# Patient Record
Sex: Female | Born: 1955 | Race: White | Hispanic: No | Smoking: Former smoker
Health system: Southern US, Community
[De-identification: ages and names within clinical notes are randomized; demographics above are authoritative.]

## PROBLEM LIST (undated history)

## (undated) DIAGNOSIS — G8929 Other chronic pain: Secondary | ICD-10-CM

## (undated) DIAGNOSIS — F4310 Post-traumatic stress disorder, unspecified: Secondary | ICD-10-CM

## (undated) DIAGNOSIS — R011 Cardiac murmur, unspecified: Secondary | ICD-10-CM

## (undated) DIAGNOSIS — Z5189 Encounter for other specified aftercare: Secondary | ICD-10-CM

## (undated) DIAGNOSIS — I10 Essential (primary) hypertension: Secondary | ICD-10-CM

## (undated) DIAGNOSIS — F419 Anxiety disorder, unspecified: Secondary | ICD-10-CM

## (undated) DIAGNOSIS — M1990 Unspecified osteoarthritis, unspecified site: Secondary | ICD-10-CM

## (undated) DIAGNOSIS — H269 Unspecified cataract: Secondary | ICD-10-CM

## (undated) DIAGNOSIS — E785 Hyperlipidemia, unspecified: Secondary | ICD-10-CM

## (undated) DIAGNOSIS — B1920 Unspecified viral hepatitis C without hepatic coma: Secondary | ICD-10-CM

## (undated) DIAGNOSIS — F329 Major depressive disorder, single episode, unspecified: Secondary | ICD-10-CM

## (undated) DIAGNOSIS — K219 Gastro-esophageal reflux disease without esophagitis: Secondary | ICD-10-CM

## (undated) DIAGNOSIS — M542 Cervicalgia: Secondary | ICD-10-CM

## (undated) DIAGNOSIS — F32A Depression, unspecified: Secondary | ICD-10-CM

## (undated) DIAGNOSIS — C539 Malignant neoplasm of cervix uteri, unspecified: Secondary | ICD-10-CM

## (undated) DIAGNOSIS — F319 Bipolar disorder, unspecified: Secondary | ICD-10-CM

## (undated) DIAGNOSIS — N189 Chronic kidney disease, unspecified: Secondary | ICD-10-CM

## (undated) HISTORY — DX: Post-traumatic stress disorder, unspecified: F43.10

## (undated) HISTORY — DX: Cervicalgia: M54.2

## (undated) HISTORY — PX: APPENDECTOMY: SHX54

## (undated) HISTORY — DX: Unspecified cataract: H26.9

## (undated) HISTORY — DX: Unspecified osteoarthritis, unspecified site: M19.90

## (undated) HISTORY — DX: Malignant neoplasm of cervix uteri, unspecified: C53.9

## (undated) HISTORY — PX: KIDNEY STONE SURGERY: SHX686

## (undated) HISTORY — DX: Gastro-esophageal reflux disease without esophagitis: K21.9

## (undated) HISTORY — PX: CATARACT EXTRACTION: SUR2

## (undated) HISTORY — DX: Hyperlipidemia, unspecified: E78.5

## (undated) HISTORY — DX: Other chronic pain: G89.29

## (undated) HISTORY — DX: Chronic kidney disease, unspecified: N18.9

## (undated) HISTORY — PX: ABDOMINAL HYSTERECTOMY: SHX81

## (undated) HISTORY — DX: Bipolar disorder, unspecified: F31.9

## (undated) HISTORY — DX: Essential (primary) hypertension: I10

## (undated) HISTORY — DX: Unspecified viral hepatitis C without hepatic coma: B19.20

## (undated) HISTORY — DX: Encounter for other specified aftercare: Z51.89

---

## 2015-02-25 ENCOUNTER — Encounter: Payer: Self-pay | Admitting: Gastroenterology

## 2015-03-19 ENCOUNTER — Ambulatory Visit: Payer: Self-pay | Admitting: Gastroenterology

## 2015-03-26 ENCOUNTER — Ambulatory Visit (INDEPENDENT_AMBULATORY_CARE_PROVIDER_SITE_OTHER): Payer: Self-pay | Admitting: Gastroenterology

## 2015-03-26 ENCOUNTER — Encounter (INDEPENDENT_AMBULATORY_CARE_PROVIDER_SITE_OTHER): Payer: Self-pay

## 2015-03-26 ENCOUNTER — Other Ambulatory Visit: Payer: Self-pay

## 2015-03-26 ENCOUNTER — Encounter: Payer: Self-pay | Admitting: Gastroenterology

## 2015-03-26 VITALS — BP 132/88 | HR 100 | Temp 97.3°F | Ht 63.0 in | Wt 177.4 lb

## 2015-03-26 DIAGNOSIS — K219 Gastro-esophageal reflux disease without esophagitis: Secondary | ICD-10-CM | POA: Insufficient documentation

## 2015-03-26 DIAGNOSIS — Z1211 Encounter for screening for malignant neoplasm of colon: Secondary | ICD-10-CM

## 2015-03-26 DIAGNOSIS — R894 Abnormal immunological findings in specimens from other organs, systems and tissues: Secondary | ICD-10-CM

## 2015-03-26 DIAGNOSIS — K7460 Unspecified cirrhosis of liver: Secondary | ICD-10-CM

## 2015-03-26 DIAGNOSIS — R768 Other specified abnormal immunological findings in serum: Secondary | ICD-10-CM | POA: Insufficient documentation

## 2015-03-26 DIAGNOSIS — R1314 Dysphagia, pharyngoesophageal phase: Secondary | ICD-10-CM | POA: Insufficient documentation

## 2015-03-26 NOTE — Assessment & Plan Note (Signed)
No prior screening colonoscopy, no concerning lower GI symptoms, no family history of colon cancer.   Proceed with colonoscopy with Dr. Oneida Alar in the near future. The risks, benefits, and alternatives have been discussed in detail with the patient. They state understanding and desire to proceed.  Propofol due to polypharmacy

## 2015-03-26 NOTE — Assessment & Plan Note (Signed)
Query uncontrolled GERD, web, ring, stricture as culprit. Doubt malignancy.   Proceed with upper endoscopy and dilation in the near future with Dr. Oneida Alar. The risks, benefits, and alternatives have been discussed in detail with patient. They have stated understanding and desire to proceed.  PROPOFOL due to polypharmacy

## 2015-03-26 NOTE — Patient Instructions (Addendum)
Start taking Dexilant once each day. This is for reflux and takes the place of ranitidine. Stop Ranitidine.   I have ordered blood work to further check your Hepatitis C status.   We have ordered an ultrasound to check your liver.  You have been set up for a colonoscopy, upper endoscopy, and dilation with Dr. Oneida Alar in the near future.

## 2015-03-26 NOTE — Assessment & Plan Note (Signed)
Chronic, with associated vocal hoarseness and dysphagia. Stop Ranitidine, start Dexilant samples once daily. Patient assistance forms provided due to lack of insurance (applying for [employer]). Needs EGD in near future. See "dysphagia".

## 2015-03-26 NOTE — Assessment & Plan Note (Signed)
59 year old female with self-reported history of positive Hep C antibody, with risk factors to include prior IV drug abuse. No imaging completed or HCV RNA with PCR. Needs [place] with elastography, HCV RNA with reflex genotype. Further recommendations to follow. As of note, query hepatomegaly on exam.
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
## 2015-03-27 LAB — HCV RNA QUANT RFLX ULTRA OR GENOTYP: HCV Quantitative: NOT DETECTED IU/mL (ref <15)

## 2015-03-28 NOTE — Progress Notes (Signed)
Quick Note:  Good news! Patient's HCV RNA is NEGATIVE. She cleared the virus on her own (per her report she had hx of Hep C). Still complete ultrasound. ?hepatomegaly on exam. ______

## 2015-04-01 NOTE — Progress Notes (Signed)
Quick Note:  PT is aware of results and to complete the [place]. ______

## 2015-04-02 NOTE — Progress Notes (Signed)
cc'd to pcp 

## 2015-04-07 ENCOUNTER — Ambulatory Visit (HOSPITAL_COMMUNITY)
Admission: RE | Admit: 2015-04-07 | Discharge: 2015-04-07 | Disposition: A | Discharge status: Home / Self Care | Payer: Self-pay | Source: Ambulatory Visit | Attending: Gastroenterology | Admitting: Gastroenterology

## 2015-04-07 DIAGNOSIS — B182 Chronic viral hepatitis C: Secondary | ICD-10-CM | POA: Insufficient documentation

## 2015-04-07 DIAGNOSIS — K7460 Unspecified cirrhosis of liver: Secondary | ICD-10-CM | POA: Insufficient documentation

## 2015-04-09 NOTE — Patient Instructions (Signed)
Heather Galvan  04/09/2015     @PREFPERIOPPHARMACY @   Your procedure is scheduled on  04/15/2015  Report to [facility] at  615  A.M.  Call this number if you have problems the morning of surgery:  [phone]   Remember:  Do not eat food or drink liquids after midnight.  Take these medicines the morning of surgery with A SIP OF WATER  Effexor, celebrex, klonopin, flexaril, dexilant; voltaren, lisinopril, ultram   Do not wear jewelry, make-up or nail polish.  Do not wear lotions, powders, or perfumes.   Do not shave 48 hours prior to surgery.  Men may shave face and neck.  Do not bring valuables to the hospital.  [facility] is not responsible for any belongings or valuables.  Contacts, dentures or bridgework may not be worn into surgery.  Leave your suitcase in the car.  After surgery it may be brought to your room.  For patients admitted to the hospital, discharge time will be determined by your treatment team.  Patients discharged the day of surgery will not be allowed to drive home.   Name and phone number of your driver:  family Special instructions:  none  Please read over the following fact sheets that you were given. Pain Booklet, Coughing and Deep Breathing, Surgical Site Infection Prevention, Anesthesia Post-op Instructions and Care and Recovery After Surgery      Esophagogastroduodenoscopy Esophagogastroduodenoscopy (EGD) is a procedure to examine the lining of the esophagus, stomach, and first part of the small intestine (duodenum). A long, flexible, lighted tube with a camera attached (endoscope) is inserted down the throat to view these organs. This procedure is done to detect problems or abnormalities, such as inflammation, bleeding, ulcers, or growths, in order to treat them. The procedure lasts about 5-20 minutes. It is usually an outpatient procedure, but it may need to be performed in emergency cases in the hospital. LET YOUR CAREGIVER KNOW  ABOUT:   Allergies to food or medicine.  All medicines you are taking, including vitamins, herbs, eyedrops, and over-the-counter medicines and creams.  Use of steroids (by mouth or creams).  Previous problems you or members of your family have had with the use of anesthetics.  Any blood disorders you have.  Previous surgeries you have had.  Other health problems you have.  Possibility of pregnancy, if this applies. RISKS AND COMPLICATIONS  Generally, EGD is a safe procedure. However, as with any procedure, complications can occur. Possible complications include:  Infection.  Bleeding.  Tearing (perforation) of the esophagus, stomach, or duodenum.  Difficulty breathing or not being able to breath.  Excessive sweating.  Spasms of the larynx.  Slowed heartbeat.  Low blood pressure. BEFORE THE PROCEDURE  Do not eat or drink anything for 6-8 hours before the procedure or as directed by your caregiver.  Ask your caregiver about changing or stopping your regular medicines.  If you wear dentures, be prepared to remove them before the procedure.  Arrange for someone to drive you home after the procedure. PROCEDURE   A vein will be accessed to give medicines and fluids. A medicine to relax you (sedative) and a pain reliever will be given through that access into the vein.  A numbing medicine (local anesthetic) may be sprayed on your throat for comfort and to stop you from gagging or coughing.  A mouth guard may be placed in your mouth to protect your teeth and to keep  you from biting on the endoscope.  You will be asked to lie on your left side.  The endoscope is inserted down your throat and into the esophagus, stomach, and duodenum.  Air is put through the endoscope to allow your caregiver to view the lining of your esophagus clearly.  The esophagus, stomach, and duodenum is then examined. During the exam, your caregiver may:  Remove tissue to be examined under a  microscope (biopsy) for inflammation, infection, or other medical problems.  Remove growths.  Remove objects (foreign bodies) that are stuck.  Treat any bleeding with medicines or other devices that stop tissues from bleeding (hot cautery, clipping devices).  Widen (dilate) or stretch narrowed areas of the esophagus and stomach.  The endoscope will then be withdrawn. AFTER THE PROCEDURE  You will be taken to a recovery area to be monitored. You will be able to go home once you are stable and alert.  Do not eat or drink anything until the local anesthetic and numbing medicines have worn off. You may choke.  It is normal to feel bloated, have pain with swallowing, or have a sore throat for a short time. This will wear off.  Your caregiver should be able to discuss his or her findings with you. It will take longer to discuss the test results if any biopsies were taken. Document Released: 02/04/2005 Document Revised: 02/18/2014 Document Reviewed: 09/06/2012 [facility] Patient Information 2015 [place], [place]. This information is not intended to replace advice given to you by your health care provider. Make sure you discuss any questions you have with your health care provider. Esophageal Dilatation The esophagus is the long, narrow tube which carries food and liquid from the mouth to the stomach. Esophageal dilatation is the technique used to stretch a blocked or narrowed portion of the esophagus. This procedure is used when a part of the esophagus has become so narrow that it becomes difficult, painful or even impossible to swallow. This is generally an uncomplicated form of treatment. When this is not successful, chest surgery may be required. This is a much more extensive form of treatment with a longer recovery time. CAUSES  Some of the more common causes of blockage or strictures of the esophagus are:  Narrowing from longstanding inflammation (soreness and redness) of the lower esophagus.  This comes from the constant exposure of the lower esophagus to the acid which bubbles up from the stomach. Over time this causes scarring and narrowing of the lower esophagus.  Hiatal hernia in which a small part of the stomach bulges (herniates) up through the diaphragm. This can cause a gradual narrowing of the end of the esophagus.  Schatzki ring is a narrow ring of benign (non-cancerous) fibrous tissue which constricts the lower esophagus. The reason for this is not known.  Scleroderma is a connective tissue disorder that affects the esophagus and makes swallowing difficult.  Achalasia is an absence of nerves to the lower esophagus and to the esophageal sphincter. This is the circular muscle between the stomach and esophagus that relaxes to allow food into the stomach. After swallowing, it contracts to keep food in the stomach. This absence of nerves may be congenital (present since birth). This can cause irregular spasms of the lower esophageal muscle. This spasm does not open up to allow food and fluid through. The result is a persistent blockage with subsequent slow trickling of the esophageal contents into the stomach.  Strictures may develop from swallowing materials which damage the esophagus. Some  examples are strong acids or alkalis such as lye.  Growths such as benign (non-cancerous) and malignant (cancerous) tumors can block the esophagus.  Hereditary (present since birth) causes. DIAGNOSIS  Your caregiver often suspects this problem by taking a medical history. They will also do a physical exam. They can then prove their suspicions using X-rays and endoscopy. Endoscopy is an exam in which a tube like a small, flexible telescope is used to look at your esophagus.  TREATMENT There are different stretching (dilating) techniques that can be used. Simple bougie dilatation may be done in the office. This usually takes only a couple minutes. A numbing (anesthetic) spray of the throat is  used. Endoscopy, when done, is done in an endoscopy suite under mild sedation. When fluoroscopy is used, the procedure is performed in X-ray. Other techniques require a little longer time. Recovery is usually quick. There is no waiting time to begin eating and drinking to test success of the treatment. Following are some of the methods used. Narrowing of the esophagus is treated by making it bigger. Commonly this is a mechanical problem which can be treated with stretching. This can be done in different ways. Your caregiver will discuss these with you. Some of the means used are:  A series of graduated (increasing thickness) flexible dilators can be used. These are weighted tubes passed through the esophagus into the stomach. The tubes used become progressively larger until the desired stretched size is reached. Graduated dilators are a simple and quick way of opening the esophagus. No visualization is required.  Another method is the use of endoscopy to place a flexible wire across the stricture. The endoscope is removed and the wire left in place. A dilator with a hole through it from end to end is guided down the esophagus and across the stricture. One or more of these dilators are passed over the wire. At the end of the exam, the wire is removed. This type of treatment may be performed in the X-ray department under fluoroscopy. An advantage of this procedure is the examiner is visualizing the end opening in the esophagus.  Stretching of the esophagus may be done using balloons. Deflated balloons are placed through the endoscope and across the stricture. This type of balloon dilatation is often done at the time of endoscopy or fluoroscopy. Flexible endoscopy allows the examiner to directly view the stricture. A balloon is inserted in the deflated form into the area of narrowing. It is then inflated with air to a certain pressure that is preset for a given circumference. When inflated, it becomes sausage  shaped, stretched, and makes the stricture larger.  Achalasia requires a longer, larger balloon-type dilator. This is frequently done under X-ray control. In this situation, the spastic muscle fibers in the lower esophagus are stretched. All of the above procedures make the passage of food and water into the stomach easier. They also make it easier for stomach contents to reflux back into the esophagus. Special medications may be used following the procedure to help prevent further stricturing. Proton-pump inhibitor medications are good at decreasing the amount of acid in the stomach juice. When stomach juice refluxes into the esophagus, the juice is no longer as acidic and is less likely to burn or scar the esophagus. RISKS AND COMPLICATIONS Esophageal dilatation is usually performed effectively and without problems. Some complications that can occur are:  A small amount of bleeding almost always happens where the stretching takes place. If this is too excessive it  may require more aggressive treatment.  An uncommon complication is perforation (making a hole) of the esophagus. The esophagus is thin. It is easy to make a hole in it. If this happens, an operation may be necessary to repair this.  A small, undetected perforation could lead to an infection in the chest. This can be very serious. HOME CARE INSTRUCTIONS   If you received sedation for your procedure, do not drive, make important decisions, or perform any activities requiring your full coordination. Do not drink alcohol, take sedatives, or use any mind altering chemicals unless instructed by your caregiver.  You may use throat lozenges or warm salt water gargles if you have throat discomfort.  You can begin eating and drinking normally on return home unless instructed otherwise. Do not purposely try to force large chunks of food down to test the benefits of your procedure.  Mild discomfort can be eased with sips of ice  water.  Medications for discomfort may or may not be needed. SEEK IMMEDIATE MEDICAL CARE IF:   You begin vomiting up blood.  You develop black, tarry stools.  You develop chills or an unexplained temperature of over 101F (38.3C)  You develop chest or abdominal pain.  You develop shortness of breath, or feel light-headed or faint.  Your swallowing is becoming more painful, difficult, or you are unable to swallow. MAKE SURE YOU:   Understand these instructions.  Will watch your condition.  Will get help right away if you are not doing well or get worse. Document Released: 11/25/2005 Document Revised: 02/18/2014 Document Reviewed: 01/12/2006 [facility] Patient Information 2015 [place], [place]. This information is not intended to replace advice given to you by your health care provider. Make sure you discuss any questions you have with your health care provider. Colonoscopy A colonoscopy is an exam to look at the entire large intestine (colon). This exam can help find problems such as tumors, polyps, inflammation, and areas of bleeding. The exam takes about 1 hour.  LET [facility] CARE PROVIDER KNOW ABOUT:   Any allergies you have.  All medicines you are taking, including vitamins, herbs, eye drops, creams, and over-the-counter medicines.  Previous problems you or members of your family have had with the use of anesthetics.  Any blood disorders you have.  Previous surgeries you have had.  Medical conditions you have. RISKS AND COMPLICATIONS  Generally, this is a safe procedure. However, as with any procedure, complications can occur. Possible complications include:  Bleeding.  Tearing or rupture of the colon wall.  Reaction to medicines given during the exam.  Infection (rare). BEFORE THE PROCEDURE   Ask your health care provider about changing or stopping your regular medicines.  You may be prescribed an oral bowel prep. This involves drinking a large amount of  medicated liquid, starting the day before your procedure. The liquid will cause you to have multiple loose stools until your stool is almost clear or light green. This cleans out your colon in preparation for the procedure.  Do not eat or drink anything else once you have started the bowel prep, unless your health care provider tells you it is safe to do so.  Arrange for someone to drive you home after the procedure. PROCEDURE   You will be given medicine to help you relax (sedative).  You will lie on your side with your knees bent.  A long, flexible tube with a light and camera on the end (colonoscope) will be inserted through the rectum and  into the colon. The camera sends video back to a computer screen as it moves through the colon. The colonoscope also releases carbon dioxide gas to inflate the colon. This helps your health care provider see the area better.  During the exam, your health care provider may take a small tissue sample (biopsy) to be examined under a microscope if any abnormalities are found.  The exam is finished when the entire colon has been viewed. AFTER THE PROCEDURE   Do not drive for 24 hours after the exam.  You may have a small amount of blood in your stool.  You may pass moderate amounts of gas and have mild abdominal cramping or bloating. This is caused by the gas used to inflate your colon during the exam.  Ask when your test results will be ready and how you will get your results. Make sure you get your test results. Document Released: 10/01/2000 Document Revised: 07/25/2013 Document Reviewed: 06/11/2013 [facility] Patient Information 2015 [place], [place]. This information is not intended to replace advice given to you by your health care provider. Make sure you discuss any questions you have with your health care provider. PATIENT INSTRUCTIONS POST-ANESTHESIA  IMMEDIATELY FOLLOWING SURGERY:  Do not drive or operate machinery for the first twenty four hours  after surgery.  Do not make any important decisions for twenty four hours after surgery or while taking narcotic pain medications or sedatives.  If you develop intractable nausea and vomiting or a severe headache please notify your doctor immediately.  FOLLOW-UP:  Please make an appointment with your surgeon as instructed. You do not need to follow up with anesthesia unless specifically instructed to do so.  WOUND CARE INSTRUCTIONS (if applicable):  Keep a dry clean dressing on the anesthesia/puncture wound site if there is drainage.  Once the wound has quit draining you may leave it open to air.  Generally you should leave the bandage intact for twenty four hours unless there is drainage.  If the epidural site drains for more than 36-48 hours please call the anesthesia department.  QUESTIONS?:  Please feel free to call your physician or the hospital operator if you have any questions, and they will be happy to assist you.

## 2015-04-10 ENCOUNTER — Encounter (HOSPITAL_COMMUNITY): Payer: Self-pay

## 2015-04-10 ENCOUNTER — Encounter (HOSPITAL_COMMUNITY)
Admission: RE | Admit: 2015-04-10 | Discharge: 2015-04-10 | Disposition: A | Discharge status: Home / Self Care | Payer: Self-pay | Source: Ambulatory Visit | Attending: Gastroenterology | Admitting: Gastroenterology

## 2015-04-10 DIAGNOSIS — R1310 Dysphagia, unspecified: Secondary | ICD-10-CM | POA: Insufficient documentation

## 2015-04-10 DIAGNOSIS — Z01812 Encounter for preprocedural laboratory examination: Secondary | ICD-10-CM | POA: Insufficient documentation

## 2015-04-10 DIAGNOSIS — Z01810 Encounter for preprocedural cardiovascular examination: Secondary | ICD-10-CM | POA: Insufficient documentation

## 2015-04-10 HISTORY — DX: Depression, unspecified: F32.A

## 2015-04-10 HISTORY — DX: Cardiac murmur, unspecified: R01.1

## 2015-04-10 HISTORY — DX: Anxiety disorder, unspecified: F41.9

## 2015-04-10 HISTORY — DX: Major depressive disorder, single episode, unspecified: F32.9

## 2015-04-10 LAB — BASIC METABOLIC PANEL
Anion gap: 8 (ref 5–15)
BUN: 16 mg/dL (ref 6–20)
CALCIUM: 9.5 mg/dL (ref 8.9–10.3)
CO2: 32 mmol/L (ref 22–32)
Chloride: 99 mmol/L — ABNORMAL LOW (ref 101–111)
Creatinine, Ser: 0.96 mg/dL (ref 0.44–1.00)
GFR calc Af Amer: >60 mL/min (ref >60)
GLUCOSE: 107 mg/dL — AB (ref 65–99)
Potassium: 4.5 mmol/L (ref 3.5–5.1)
SODIUM: 139 mmol/L (ref 135–145)

## 2015-04-10 LAB — CBC WITH DIFFERENTIAL/PLATELET
Basophils Absolute: 0.1 10*3/uL (ref 0.0–0.1)
Basophils Relative: 1 % (ref 0–1)
EOS ABS: 0.1 10*3/uL (ref 0.0–0.7)
Eosinophils Relative: 2 % (ref 0–5)
HEMATOCRIT: 40.8 % (ref 36.0–46.0)
HEMOGLOBIN: 14.2 g/dL (ref 12.0–15.0)
Lymphocytes Relative: 33 % (ref 12–46)
Lymphs Abs: 3 10*3/uL (ref 0.7–4.0)
MCH: 31.3 pg (ref 26.0–34.0)
MCHC: 34.8 g/dL (ref 30.0–36.0)
MCV: 89.9 fL (ref 78.0–100.0)
MONOS PCT: 7 % (ref 3–12)
Monocytes Absolute: 0.6 10*3/uL (ref 0.1–1.0)
Neutro Abs: 5.2 10*3/uL (ref 1.7–7.7)
Neutrophils Relative %: 58 % (ref 43–77)
Platelets: 276 10*3/uL (ref 150–400)
RBC: 4.54 MIL/uL (ref 3.87–5.11)
RDW: 12.1 % (ref 11.5–15.5)
WBC: 8.9 10*3/uL (ref 4.0–10.5)

## 2015-04-13 NOTE — Progress Notes (Signed)
Quick Note:  Hep C RNA was negative. Elastrography showed Metavir score of F3/F4. She is scheduled for an EGD. This can help sort out if she has stigmata of chronic liver disease. Would recommend obtaining Hep A and B vaccinations if not already done. Also, she has a 41mm gallbladder polyp that needs surveillance. Needs repeat US abdomen in 6 months. Low threshold for elective cholecystectomy if any biliary issues in the future due to known gallbladder polyp and risk of cancer. ______

## 2015-04-14 NOTE — Progress Notes (Signed)
Quick Note:  Pt is aware. ______ 

## 2015-04-15 ENCOUNTER — Ambulatory Visit (HOSPITAL_COMMUNITY): Payer: Self-pay | Admitting: Anesthesiology

## 2015-04-15 ENCOUNTER — Encounter (HOSPITAL_COMMUNITY): Payer: Self-pay

## 2015-04-15 ENCOUNTER — Telehealth: Payer: Self-pay

## 2015-04-15 ENCOUNTER — Encounter (HOSPITAL_COMMUNITY)
Admission: RE | Disposition: A | Discharge status: Home / Self Care | Payer: Self-pay | Source: Ambulatory Visit | Attending: Gastroenterology

## 2015-04-15 ENCOUNTER — Ambulatory Visit (HOSPITAL_COMMUNITY)
Admission: RE | Admit: 2015-04-15 | Discharge: 2015-04-15 | Disposition: A | Discharge status: Home / Self Care | Payer: Self-pay | Source: Ambulatory Visit | Attending: Gastroenterology | Admitting: Gastroenterology

## 2015-04-15 DIAGNOSIS — R1310 Dysphagia, unspecified: Secondary | ICD-10-CM

## 2015-04-15 DIAGNOSIS — K648 Other hemorrhoids: Secondary | ICD-10-CM | POA: Insufficient documentation

## 2015-04-15 DIAGNOSIS — F4310 Post-traumatic stress disorder, unspecified: Secondary | ICD-10-CM | POA: Insufficient documentation

## 2015-04-15 DIAGNOSIS — G8929 Other chronic pain: Secondary | ICD-10-CM | POA: Insufficient documentation

## 2015-04-15 DIAGNOSIS — Z79899 Other long term (current) drug therapy: Secondary | ICD-10-CM | POA: Insufficient documentation

## 2015-04-15 DIAGNOSIS — F419 Anxiety disorder, unspecified: Secondary | ICD-10-CM | POA: Insufficient documentation

## 2015-04-15 DIAGNOSIS — K2950 Unspecified chronic gastritis without bleeding: Secondary | ICD-10-CM | POA: Insufficient documentation

## 2015-04-15 DIAGNOSIS — M542 Cervicalgia: Secondary | ICD-10-CM | POA: Insufficient documentation

## 2015-04-15 DIAGNOSIS — Z7982 Long term (current) use of aspirin: Secondary | ICD-10-CM | POA: Insufficient documentation

## 2015-04-15 DIAGNOSIS — K2970 Gastritis, unspecified, without bleeding: Secondary | ICD-10-CM

## 2015-04-15 DIAGNOSIS — F319 Bipolar disorder, unspecified: Secondary | ICD-10-CM | POA: Insufficient documentation

## 2015-04-15 DIAGNOSIS — K219 Gastro-esophageal reflux disease without esophagitis: Secondary | ICD-10-CM | POA: Insufficient documentation

## 2015-04-15 DIAGNOSIS — Q438 Other specified congenital malformations of intestine: Secondary | ICD-10-CM | POA: Insufficient documentation

## 2015-04-15 DIAGNOSIS — Z8541 Personal history of malignant neoplasm of cervix uteri: Secondary | ICD-10-CM | POA: Insufficient documentation

## 2015-04-15 DIAGNOSIS — Z1211 Encounter for screening for malignant neoplasm of colon: Secondary | ICD-10-CM | POA: Insufficient documentation

## 2015-04-15 DIAGNOSIS — B1920 Unspecified viral hepatitis C without hepatic coma: Secondary | ICD-10-CM | POA: Insufficient documentation

## 2015-04-15 HISTORY — PX: ESOPHAGOGASTRODUODENOSCOPY (EGD) WITH PROPOFOL: SHX5813

## 2015-04-15 HISTORY — PX: SAVORY DILATION: SHX5439

## 2015-04-15 HISTORY — PX: BIOPSY: SHX5522

## 2015-04-15 HISTORY — PX: COLONOSCOPY WITH PROPOFOL: SHX5780

## 2015-04-15 SURGERY — COLONOSCOPY WITH PROPOFOL
Anesthesia: Monitor Anesthesia Care

## 2015-04-15 MED ORDER — LIDOCAINE VISCOUS 2 % MT SOLN
15.0000 mL | OROMUCOSAL | Status: AC
  Administered 2015-04-15: 15 mL via OROMUCOSAL

## 2015-04-15 MED ORDER — LIDOCAINE HCL (CARDIAC) 10 MG/ML IV SOLN
INTRAVENOUS | Status: DC | PRN
  Administered 2015-04-15: 50 mg via INTRAVENOUS

## 2015-04-15 MED ORDER — PROPOFOL 10 MG/ML IV BOLUS
INTRAVENOUS | Status: AC
  Filled 2015-04-15: qty 20

## 2015-04-15 MED ORDER — MIDAZOLAM HCL 2 MG/2ML IJ SOLN
INTRAMUSCULAR | Status: AC
  Filled 2015-04-15: qty 2

## 2015-04-15 MED ORDER — GLYCOPYRROLATE 0.2 MG/ML IJ SOLN
0.2000 mg | Freq: Once | INTRAMUSCULAR | Status: AC
  Administered 2015-04-15: 0.2 mg via INTRAVENOUS

## 2015-04-15 MED ORDER — LIDOCAINE VISCOUS 2 % MT SOLN: OROMUCOSAL | Status: AC

## 2015-04-15 MED ORDER — LIDOCAINE HCL (PF) 1 % IJ SOLN
INTRAMUSCULAR | Status: AC
  Filled 2015-04-15: qty 5

## 2015-04-15 MED ORDER — WATER FOR IRRIGATION, STERILE IR SOLN
Status: DC | PRN
  Administered 2015-04-15: 1000 mL

## 2015-04-15 MED ORDER — MIDAZOLAM HCL 2 MG/2ML IJ SOLN
1.0000 mg | INTRAMUSCULAR | Status: DC | PRN
  Administered 2015-04-15: 2 mg via INTRAVENOUS

## 2015-04-15 MED ORDER — ONDANSETRON HCL 4 MG/2ML IJ SOLN
INTRAMUSCULAR | Status: AC
  Filled 2015-04-15: qty 2

## 2015-04-15 MED ORDER — PROPOFOL INFUSION 10 MG/ML OPTIME
INTRAVENOUS | Status: DC | PRN
  Administered 2015-04-15 (×2): 110 ug/kg/min via INTRAVENOUS
  Administered 2015-04-15: 125 ug/kg/min via INTRAVENOUS

## 2015-04-15 MED ORDER — STERILE WATER FOR IRRIGATION IR SOLN
Status: DC | PRN
  Administered 2015-04-15: 1000 mL

## 2015-04-15 MED ORDER — PROPOFOL 10 MG/ML IV BOLUS
INTRAVENOUS | Status: DC | PRN
  Administered 2015-04-15: 16 mg via INTRAVENOUS
  Administered 2015-04-15: 5 mg via INTRAVENOUS

## 2015-04-15 MED ORDER — FENTANYL CITRATE (PF) 100 MCG/2ML IJ SOLN: 25.0000 ug | INTRAMUSCULAR | Status: DC | PRN

## 2015-04-15 MED ORDER — LIDOCAINE VISCOUS 2 % MT SOLN
OROMUCOSAL | Status: AC
  Filled 2015-04-15: qty 15

## 2015-04-15 MED ORDER — GLYCOPYRROLATE 0.2 MG/ML IJ SOLN
INTRAMUSCULAR | Status: AC
  Filled 2015-04-15: qty 1

## 2015-04-15 MED ORDER — LACTATED RINGERS IV SOLN
INTRAVENOUS | Status: DC
  Administered 2015-04-15: 07:00:00 via INTRAVENOUS

## 2015-04-15 MED ORDER — ONDANSETRON HCL 4 MG/2ML IJ SOLN
4.0000 mg | Freq: Once | INTRAMUSCULAR | Status: AC
  Administered 2015-04-15: 4 mg via INTRAVENOUS

## 2015-04-15 MED ORDER — ONDANSETRON HCL 4 MG/2ML IJ SOLN: 4.0000 mg | Freq: Once | INTRAMUSCULAR | Status: DC | PRN

## 2015-04-15 MED ORDER — FENTANYL CITRATE (PF) 100 MCG/2ML IJ SOLN
INTRAMUSCULAR | Status: AC
  Filled 2015-04-15: qty 2

## 2015-04-15 MED ORDER — FENTANYL CITRATE (PF) 100 MCG/2ML IJ SOLN
25.0000 ug | INTRAMUSCULAR | Status: AC
  Administered 2015-04-15 (×2): 25 ug via INTRAVENOUS

## 2015-04-15 MED ORDER — MIDAZOLAM HCL 5 MG/5ML IJ SOLN
INTRAMUSCULAR | Status: DC | PRN
  Administered 2015-04-15 (×2): 0.5 mg via INTRAVENOUS
  Administered 2015-04-15: 1 mg via INTRAVENOUS

## 2015-04-15 MED ORDER — DEXLANSOPRAZOLE 60 MG PO CPDR: DELAYED_RELEASE_CAPSULE | ORAL | Status: DC

## 2015-04-15 SURGICAL SUPPLY — 27 items
BLOCK BITE 60FR ADLT L/F BLUE (MISCELLANEOUS) ×3 IMPLANT
ELECT REM PT RETURN 9FT ADLT (ELECTROSURGICAL)
ELECTRODE REM PT RTRN 9FT ADLT (ELECTROSURGICAL) IMPLANT
FCP BXJMBJMB 240X2.8X (CUTTING FORCEPS)
FLOOR PAD 36X40 (MISCELLANEOUS) ×3
FORCEP RJ3 GP 1.8X160 W-NEEDLE (CUTTING FORCEPS) IMPLANT
FORCEPS BIOP RAD 4 LRG CAP 4 (CUTTING FORCEPS) ×3 IMPLANT
FORCEPS BIOP RJ4 240 W/NDL (CUTTING FORCEPS)
FORCEPS BXJMBJMB 240X2.8X (CUTTING FORCEPS) IMPLANT
FORMALIN 10 PREFIL 20ML (MISCELLANEOUS) ×3 IMPLANT
INJECTOR/SNARE I SNARE (MISCELLANEOUS) IMPLANT
KIT ENDO PROCEDURE PEN (KITS) ×3 IMPLANT
MANIFOLD NEPTUNE II (INSTRUMENTS) ×3 IMPLANT
NEEDLE SCLEROTHERAPY 25GX240 (NEEDLE) IMPLANT
OVERTUBE ENDOCUFF GREEN (MISCELLANEOUS) ×3 IMPLANT
PAD FLOOR 36X40 (MISCELLANEOUS) ×1 IMPLANT
PROBE APC STR FIRE (PROBE) IMPLANT
PROBE INJECTION GOLD (MISCELLANEOUS)
PROBE INJECTION GOLD 7FR (MISCELLANEOUS) IMPLANT
SNARE ROTATE MED OVAL 20MM (MISCELLANEOUS) IMPLANT
SNARE SHORT THROW 13M SML OVAL (MISCELLANEOUS) IMPLANT
SYR 50ML LL SCALE MARK (SYRINGE) ×3 IMPLANT
SYR INFLATION 60ML (SYRINGE) IMPLANT
TRAP SPECIMEN MUCOUS 40CC (MISCELLANEOUS) IMPLANT
TUBING ENDO SMARTCAP PENTAX (MISCELLANEOUS) ×3 IMPLANT
TUBING IRRIGATION ENDOGATOR (MISCELLANEOUS) ×3 IMPLANT
WATER STERILE IRR 1000ML POUR (IV SOLUTION) ×3 IMPLANT

## 2015-04-15 NOTE — Telephone Encounter (Signed)
Pt is aware and she will try to do the salt water gargles and do the Lidocaine and if that doesn't work she will go to the ED.

## 2015-04-15 NOTE — Telephone Encounter (Signed)
Sent pager and paged Dr. Oneida Alar.

## 2015-04-15 NOTE — Progress Notes (Signed)
REVIEWED-NO ADDITIONAL RECOMMENDATIONS. 

## 2015-04-15 NOTE — Discharge Instructions (Signed)
YOU DID NOT HAVE ANY POLYPS. YOU HAVE small HEMORRHOIDS. 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 I STRETCHED YOUR ESOPHAGUS DUE YOUR PROBLEMS SWALLOWING. I DID NOT SEE A DEFINITE STRICTURE. I BIOPSIED YOUR STOMACH.   CONTINUE YOUR WEIGHT LOSS EFFORTS. Lose 10 lbs.  FOLLOW A HIGH FIBER/LOW FAT DIET. AVOID ITEMS THAT CAUSE BLOATING. SEE INFO BELOW.  CONTINUE DEXILANT PRIOR TO YOUR FIRST MEAL.   DO NOT USE VOLTAREN AND CELEBREX. IT WILL KILL YOUR KIDNEYS.  YOUR BIOPSY RESULTS WILL BE AVAILABLE IN MY CHART AFTER JUN 30 AND MY OFFICE WILL CONTACT YOU IN 10-14 DAYS WITH YOUR RESULTS.   Follow up in 6 mos.  Next colonoscopy in 10 years.   ENDOSCOPY Care After Read the instructions outlined below and refer to this sheet in the next week. These discharge instructions provide you with general information on caring for yourself after you leave the hospital. While your treatment has been planned according to the most current medical practices available, unavoidable complications occasionally occur. If you have any problems or questions after discharge, call DR. Jerard Bays, [phone].  ACTIVITY  You may resume your regular activity, but move at a slower pace for the next 24 hours.   Take frequent rest periods for the next 24 hours.   Walking will help get rid of the air and reduce the bloated feeling in your belly (abdomen).   No driving for 24 hours (because of the medicine (anesthesia) used during the test).   You may shower.   Do not sign any important legal documents or operate any machinery for 24 hours (because of the anesthesia used during the test).    NUTRITION  Drink plenty of fluids.   You may resume your normal diet as instructed by your doctor.   Begin with a light meal and progress to your normal diet. Heavy or fried foods are harder to digest and may make you feel sick to your stomach (nauseated).   Avoid alcoholic  beverages for 24 hours or as instructed.    MEDICATIONS  You may resume your normal medications.   WHAT YOU CAN EXPECT TODAY  Some feelings of bloating in the abdomen.   Passage of more gas than usual.   Spotting of blood in your stool or on the toilet paper  .  IF YOU HAD POLYPS REMOVED DURING THE ENDOSCOPY:  Eat a soft diet IF YOU HAVE NAUSEA, BLOATING, ABDOMINAL PAIN, OR VOMITING.    FINDING OUT THE RESULTS OF YOUR TEST Not all test results are available during your visit. DR. Oneida Alar WILL CALL YOU WITHIN 14 DAYS OF YOUR PROCEDUE WITH YOUR RESULTS. Do not assume everything is normal if you have not heard from DR. Kilan Banfill, CALL HER OFFICE AT [phone].  SEEK IMMEDIATE MEDICAL ATTENTION AND CALL THE OFFICE: [phone] IF:  You have more than a spotting of blood in your stool.   Your belly is swollen (abdominal distention).   You are nauseated or vomiting.   You have a temperature over 101F.   You have abdominal pain or discomfort that is severe or gets worse throughout the day.  Low-Fat Diet BREADS, CEREALS, PASTA, RICE, DRIED PEAS, AND BEANS These products are high in carbohydrates and most are low in fat. Therefore, they can be increased in the diet as substitutes for fatty foods. They too, however, contain calories and should not be eaten in excess. Cereals can be eaten for snacks as well as  for breakfast.   FRUITS AND VEGETABLES It is good to eat fruits and vegetables. Besides being sources of fiber, both are rich in vitamins and some minerals. They help you get the daily allowances of these nutrients. Fruits and vegetables can be used for snacks and desserts.  MEATS Limit lean meat, chicken, [place], and fish to no more than 6 ounces per day. Beef, Pork, and Lamb Use lean cuts of beef, pork, and lamb. Lean cuts include:  Extra-lean ground beef.  Arm roast.  Sirloin tip.  Center-cut ham.  Round steak.  Loin chops.  Rump roast.  Tenderloin.  Trim all fat  off the outside of meats before cooking. It is not necessary to severely decrease the intake of red meat, but lean choices should be made. Lean meat is rich in protein and contains a highly absorbable form of iron. Premenopausal women, in particular, should avoid reducing lean red meat because this could increase the risk for low red blood cells (iron-deficiency anemia).  Chicken and [place] These are good sources of protein. The fat of poultry can be reduced by removing the skin and underlying fat layers before cooking. Chicken and [place] can be substituted for lean red meat in the diet. Poultry should not be fried or covered with high-fat sauces. Fish and Shellfish Fish is a good source of protein. Shellfish contain cholesterol, but they usually are low in saturated fatty acids. The preparation of fish is important. Like chicken and [place], they should not be fried or covered with high-fat sauces. EGGS Egg whites contain no fat or cholesterol. They can be eaten often. Try 1 to 2 egg whites instead of whole eggs in recipes or use egg substitutes that do not contain yolk. MILK AND DAIRY PRODUCTS Use skim or 1% milk instead of 2% or whole milk. Decrease whole milk, natural, and processed cheeses. Use nonfat or low-fat (2%) cottage cheese or low-fat cheeses made from vegetable oils. Choose nonfat or low-fat (1 to 2%) yogurt. Experiment with evaporated skim milk in recipes that call for heavy cream. Substitute low-fat yogurt or low-fat cottage cheese for sour cream in dips and salad dressings. Have at least 2 servings of low-fat dairy products, such as 2 glasses of skim (or 1%) milk each day to help get your daily calcium intake. FATS AND OILS Reduce the total intake of fats, especially saturated fat. Butterfat, lard, and beef fats are high in saturated fat and cholesterol. These should be avoided as much as possible. Vegetable fats do not contain cholesterol, but certain vegetable fats, such as coconut oil,  palm oil, and palm kernel oil are very high in saturated fats. These should be limited. These fats are often used in bakery goods, processed foods, popcorn, oils, and nondairy creamers. Vegetable shortenings and some peanut butters contain hydrogenated oils, which are also saturated fats. Read the labels on these foods and check for saturated vegetable oils. Unsaturated vegetable oils and fats do not raise blood cholesterol. However, they should be limited because they are fats and are high in calories. Total fat should still be limited to 30% of your daily caloric intake. Desirable liquid vegetable oils are corn oil, cottonseed oil, olive oil, canola oil, safflower oil, soybean oil, and sunflower oil. Peanut oil is not as good, but small amounts are acceptable. Buy a heart-healthy tub margarine that has no partially hydrogenated oils in the ingredients. Mayonnaise and salad dressings often are made from unsaturated fats, but they should also be limited because of their  high calorie and fat content. Seeds, nuts, peanut butter, olives, and avocados are high in fat, but the fat is mainly the unsaturated type. These foods should be limited mainly to avoid excess calories and fat. OTHER EATING TIPS Snacks  Most sweets should be limited as snacks. They tend to be rich in calories and fats, and their caloric content outweighs their nutritional value. Some good choices in snacks are graham crackers, melba toast, soda crackers, bagels (no egg), English muffins, fruits, and vegetables. These snacks are preferable to snack crackers, [place] fries, TORTILLA CHIPS, and POTATO chips. Popcorn should be air-popped or cooked in small amounts of liquid vegetable oil. Desserts Eat fruit, low-fat yogurt, and fruit ices instead of pastries, cake, and cookies. Sherbet, angel food cake, gelatin dessert, frozen low-fat yogurt, or other frozen products that do not contain saturated fat (pure fruit juice bars, frozen ice pops) are  also acceptable.  COOKING METHODS Choose those methods that use little or no fat. They include: Poaching.  Braising.  Steaming.  Grilling.  Baking.  Stir-frying.  Broiling.  Microwaving.  Foods can be cooked in a nonstick pan without added fat, or use a nonfat cooking spray in regular cookware. Limit fried foods and avoid frying in saturated fat. Add moisture to lean meats by using water, broth, cooking wines, and other nonfat or low-fat sauces along with the cooking methods mentioned above. Soups and stews should be chilled after cooking. The fat that forms on top after a few hours in the refrigerator should be skimmed off. When preparing meals, avoid using excess salt. Salt can contribute to raising blood pressure in some people.  EATING AWAY FROM HOME Order entres, potatoes, and vegetables without sauces or butter. When meat exceeds the size of a deck of cards (3 to 4 ounces), the rest can be taken home for another meal. Choose vegetable or fruit salads and ask for low-calorie salad dressings to be served on the side. Use dressings sparingly. Limit high-fat toppings, such as bacon, crumbled eggs, cheese, sunflower seeds, and olives. Ask for heart-healthy tub margarine instead of butter.  High-Fiber Diet A high-fiber diet changes your normal diet to include more whole grains, legumes, fruits, and vegetables. Changes in the diet involve replacing refined carbohydrates with unrefined foods. The calorie level of the diet is essentially unchanged. The Dietary Reference Intake (recommended amount) for adult males is 38 grams per day. For adult females, it is 25 grams per day. Pregnant and lactating women should consume 28 grams of fiber per day. Fiber is the intact part of a plant that is not broken down during digestion. Functional fiber is fiber that has been isolated from the plant to provide a beneficial effect in the body. PURPOSE  Increase stool bulk.   Ease and regulate bowel movements.    Lower cholesterol.  INDICATIONS THAT YOU NEED MORE FIBER  Constipation and hemorrhoids.   Uncomplicated diverticulosis (intestine condition) and irritable bowel syndrome.   Weight management.   As a protective measure against hardening of the arteries (atherosclerosis), diabetes, and cancer.   GUIDELINES FOR INCREASING FIBER IN THE DIET  Start adding fiber to the diet slowly. A gradual increase of about 5 more grams (2 slices of whole-wheat bread, 2 servings of most fruits or vegetables, or 1 bowl of high-fiber cereal) per day is best. Too rapid an increase in fiber may result in constipation, flatulence, and bloating.   Drink enough water and fluids to keep your urine clear or pale yellow. Water,  juice, or caffeine-free drinks are recommended. Not drinking enough fluid may cause constipation.   Eat a variety of high-fiber foods rather than one type of fiber.   Try to increase your intake of fiber through using high-fiber foods rather than fiber pills or supplements that contain small amounts of fiber.   The goal is to change the types of food eaten. Do not supplement your present diet with high-fiber foods, but replace foods in your present diet.  INCLUDE A VARIETY OF FIBER SOURCES  Replace refined and processed grains with whole grains, canned fruits with fresh fruits, and incorporate other fiber sources. White rice, white breads, and most bakery goods contain little or no fiber.   Brown whole-grain rice, buckwheat oats, and many fruits and vegetables are all good sources of fiber. These include: broccoli, Brussels sprouts, cabbage, cauliflower, beets, sweet potatoes, white potatoes (skin on), carrots, tomatoes, eggplant, squash, berries, fresh fruits, and dried fruits.   Cereals appear to be the richest source of fiber. Cereal fiber is found in whole grains and bran. Bran is the fiber-rich outer coat of cereal grain, which is largely removed in refining. In whole-grain cereals, the  bran remains. In breakfast cereals, the largest amount of fiber is found in those with "bran" in their names. The fiber content is sometimes indicated on the label.   You may need to include additional fruits and vegetables each day.   In baking, for 1 cup white flour, you may use the following substitutions:   1 cup whole-wheat flour minus 2 tablespoons.   1/2 cup white flour plus 1/2 cup whole-wheat flour.     GERD  SYMPTOMS Common symptoms of GERD are heartburn (burning in your chest). This is worse when lying down or bending over. It may also cause belching and indigestion. Some of the things which make GERD worse are:  Increased weight pushes on stomach making acid rise more easily.   Smoking markedly increases acid production.   Alcohol decreases lower esophageal sphincter pressure (valve between stomach and esophagus), allowing acid from stomach into esophagus.   Late evening meals and going to bed with a full stomach increases pressure.   Anything that causes an increase in acid production.    HOME CARE INSTRUCTIONS  Try to achieve and maintain an ideal body weight.   Avoid drinking alcoholic beverages.   DO NOT smokE.   Do not wear tight clothing around your chest or stomach.   Eat smaller meals and eat more frequently. This keeps your stomach from getting too full. Eat slowly.   Do not lie down for 2 or 3 hours after eating. Do not eat or drink anything 1 to 2 hours before going to bed.   Avoid caffeine beverages (colas, coffee, cocoa, tea), fatty foods, citrus fruits and all other foods and drinks that contain acid and that seem to increase the problems.   Avoid bending over, especially after eating OR STRAINING. Anything that increases the pressure in your belly increases the amount of acid that may be pushed up into your esophagus.   Gastritis  Gastritis is an inflammation (the body's way of reacting to injury and/or infection) of the stomach. It is often  caused by viral or bacterial (germ) infections. It can also be caused BY ASPIRIN, BC/GOODY POWDER'S, (IBUPROFEN) MOTRIN, OR ALEVE (NAPROXEN), chemicals (including alcohol), SPICY FOODS, and medications. This illness may be associated with generalized malaise (feeling tired, not well), UPPER ABDOMINAL STOMACH cramps, and fever. One common bacterial cause  of gastritis is an organism known as H. Pylori. This can be treated with antibiotics.

## 2015-04-15 NOTE — Telephone Encounter (Signed)
PLEASE CALL PT. SHE HAS TWO CHOICES SHE CAN GO TO ED FOR AN EVALUATION OR SHE CAN USE SALT WATER GARGLES TID FOR 3 DAYS. SHE SHOULD USE VISCOUS LIDOCAINE 2 TSP Q4H PRN FOR THROAT PAIN.

## 2015-04-15 NOTE — Op Note (Signed)
[facility] [address] [place], [address]   ENDOSCOPY PROCEDURE REPORT  PATIENT: Heather Galvan, Heather Galvan  MR#: [id number] BIRTHDATE: 1955-12-06 , 89  yrs. old GENDER: female  ENDOSCOPIST: Danie Binder, MD REFFERED DD:[id number] Muse, PA  PROCEDURE DATE:  2015/05/13 PROCEDURE:   EGD with biopsy and EGD with dilatation over guidewire   INDICATIONS:1.  dyspepsia.   2.  dysphagia. USING ASA, VOLTAREN, AND CELEBREX MEDICATIONS: Monitored anesthesia care TOPICAL ANESTHETIC: Viscous Xylocaine  DESCRIPTION OF PROCEDURE:   After the risks benefits and alternatives of the procedure were thoroughly explained, informed consent was obtained.  The     endoscope was introduced through the mouth and advanced to the second portion of the duodenum. The instrument was slowly withdrawn as the mucosa was carefully examined.  Prior to withdrawal of the scope, the guidwire was placed.  The esophagus was dilated successfully.  The patient was recovered in endoscopy and discharged home in satisfactory condition. Estimated blood loss is zero unless otherwise noted in this procedure report.   ESOPHAGUS: The mucosa of the esophagus appeared normal.   STOMACH: Moderate non-erosive gastritis (inflammation) was found in the gastric body and gastric antrum.  Multiple biopsies were performed using cold forceps.   DUODENUM: The duodenal mucosa showed no abnormalities in the bulb and second portion of the duodenum. EMPIRIC DILATION PERFORMED DUE TO C/O DYSPHAGIA AND POSSIBLE PROXIMAL ESOPHAGEAL WEB. Dilation was then performed at the gastroesphageal junction Dilator: Savary over guidewire Size(s): 15-16 MM Resistance: minimal Heme: none  COMPLICATIONS: There were no immediate complications.  ENDOSCOPIC IMPRESSION: 1.   The mucosa of the esophagus appeared normal 2.   MILD Non-erosive gastritis  RECOMMENDATIONS: CONTINUE YOUR WEIGHT LOSS EFFORTS.  Lose 10 lbs. FOLLOW A HIGH FIBER/LOW FAT  DIET. CONTINUE DEXILANT PRIOR TO YOUR FIRST MEAL. DO NOT USE VOLTAREN AND CELEBREX. AWAIT BIOPSY RESULTS. Follow up in 6 mos. Next colonoscopy in 10 years.  eSigned:  Danie Binder, MD 2015-05-13 9:57 AM   CPT CODES: ICD CODES:  The ICD and CPT codes recommended by this software are interpretations from the data that the clinical staff has captured with the software.  The verification of the translation of this report to the ICD and CPT codes and modifiers is the sole responsibility of the health care institution and practicing physician where this report was generated.  [employer] will not be held responsible for the validity of the ICD and CPT codes included on this report.  AMA assumes no liability for data contained or not contained herein. CPT is a Designer, television/film set of the [employer].

## 2015-04-15 NOTE — Anesthesia Procedure Notes (Signed)
Procedure Name: MAC Date/Time: 04/15/2015 7:32 AM Performed by: Vista Deck Pre-anesthesia Checklist: Patient identified, Emergency Drugs available, Suction available, Timeout performed and Patient being monitored Patient Re-evaluated:Patient Re-evaluated prior to inductionOxygen Delivery Method: Non-rebreather mask

## 2015-04-15 NOTE — H&P (Signed)
Primary Care Physician:  Raiford Simmonds., PA-C Primary Gastroenterologist:  Dr. Oneida Alar  Pre-Procedure History & Physical: HPI:  Heather Galvan is a 59 y.o. female here for DYSPHAGIA/screening.  Past Medical History  Diagnosis Date  . Hepatitis C   . Bipolar disease, chronic   . PTSD (post-traumatic stress disorder)   . GERD (gastroesophageal reflux disease)   . Chronic neck pain   . Anxiety   . Depression   . Cervical cancer   . Heart murmur     Past Surgical History  Procedure Laterality Date  . Abdominal hysterectomy      ovaries remain  . Cesarean section      X2  . Appendectomy    . Kidney stone surgery    . Cataract extraction      Prior to Admission medications   Medication Sig Start Date End Date Taking? Authorizing Provider  aspirin 81 MG tablet Take 81 mg by mouth daily.   Yes Historical Provider, MD  celecoxib (CELEBREX) 100 MG capsule Take 100 mg by mouth 2 (two) times daily.   Yes Historical Provider, MD  clonazePAM (KLONOPIN) 1 MG tablet Take 1 mg by mouth 3 (three) times daily.   Yes Historical Provider, MD  clonazePAM (KLONOPIN) 1 MG tablet Take 1 mg by mouth 2 (two) times daily.   Yes Historical Provider, MD  cyclobenzaprine (FLEXERIL) 10 MG tablet Take 10 mg by mouth 3 (three) times daily.   Yes Historical Provider, MD  dexlansoprazole (DEXILANT) 60 MG capsule Take 60 mg by mouth daily.   Yes Historical Provider, MD  diclofenac (VOLTAREN) 75 MG EC tablet Take 75 mg by mouth 2 (two) times daily.   Yes Historical Provider, MD  lisinopril-hydrochlorothiazide (PRINZIDE,ZESTORETIC) 10-12.5 MG per tablet Take 1 tablet by mouth daily.   Yes Historical Provider, MD  traMADol (ULTRAM-ER) 100 MG 24 hr tablet Take 100 mg by mouth daily as needed for pain.   Yes Historical Provider, MD  trazodone (DESYREL) 300 MG tablet Take 300 mg by mouth at bedtime.   Yes Historical Provider, MD  venlafaxine XR (EFFEXOR-XR) 150 MG 24 hr capsule Take 300 mg by mouth daily with  breakfast.   Yes Historical Provider, MD    Allergies as of 03/26/2015  . (No Known Allergies)    Family History  Problem Relation Age of Onset  . Cirrhosis Brother     alcoholic, passed away in their 4s  . Colon cancer Paternal Uncle   . Breast cancer Mother   . Uterine cancer Mother     History   Social History  . Marital Status: Legally Separated    Spouse Name: N/A  . Number of Children: N/A  . Years of Education: N/A   Occupational History  . Not on file.   Social History Main Topics  . Smoking status: Never Smoker   . Smokeless tobacco: Not on file  . Alcohol Use: No  . Drug Use: No     Comment: remote past IV drug use, marijuana intermittently  . Sexual Activity: Not on file   Other Topics Concern  . Not on file   Social History Narrative    Review of Systems: See HPI, otherwise negative ROS   Physical Exam: BP 133/80 mmHg  Pulse 54  Temp(Src) 97.6 F (36.4 C) (Oral)  Resp 18  Ht 5\' 3"  (1.6 m)  Wt 177 lb (80.287 kg)  BMI 31.36 kg/m2  SpO2 96% General:   Alert,  pleasant and cooperative in NAD  Head:  Normocephalic and atraumatic. Neck:  Supple; Lungs:  Clear throughout to auscultation.    Heart:  Regular rate and rhythm. Abdomen:  Soft, nontender and nondistended. Normal bowel sounds, without guarding, and without rebound.   Neurologic:  Alert and  oriented x4;  grossly normal neurologically.  Impression/Plan:    DYSPHAGIA/screening  PLAN:  EGD/DIL/tcs TODAY

## 2015-04-15 NOTE — Telephone Encounter (Signed)
PT called crying and said she had endoscopy done this Am and she cannot talk or swallow now. She was getting sound out to me some. She is requesting something to make her throat feel better and help her be ready to talk and to swallow.  Sending to Dr. Oneida Alar and sending pager also.

## 2015-04-15 NOTE — Op Note (Signed)
[facility] [address] [place], [address]   COLONOSCOPY PROCEDURE REPORT  PATIENT: Heather, Galvan  MR#: [id number] BIRTHDATE: 1955/11/27 , 62  yrs. old GENDER: female ENDOSCOPIST: Danie Binder, MD REFERRED KV:[id number] Muse, PA PROCEDURE DATE:  05-10-2015 PROCEDURE:   Colonoscopy, screening INDICATIONS:average risk patient for colon cancer. MEDICATIONS: Monitored anesthesia care  DESCRIPTION OF PROCEDURE:    Physical exam was performed.  Informed consent was obtained from the patient after explaining the benefits, risks, and alternatives to procedure.  The patient was connected to monitor and placed in left lateral position. Continuous oxygen was provided by nasal cannula and IV medicine administered through an indwelling cannula.  After administration of sedation and rectal exam, the patients rectum was intubated and the     colonoscope was advanced under direct visualization to the CECUM. The scope was removed slowly by carefully examining the color, texture, anatomy, and integrity mucosa on the way out.  The patient was recovered in endoscopy and discharged home in satisfactory condition. Estimated blood loss is zero unless otherwise noted in this procedure report.    COLON FINDINGS: The colon was redundant.  Manual abdominal counter-pressure was used to reach the cecum, The colonic mucosa appeared normal.  , and Small internal hemorrhoids were found.  PREP QUALITY: good.  CECAL W/D TIME: 11       minutes COMPLICATIONS: None  ENDOSCOPIC IMPRESSION: 1.   The LEFT colon IS redundant 2.   The colonic mucosa appeared normal 3.   Small internal hemorrhoids  RECOMMENDATIONS: CONTINUE YOUR WEIGHT LOSS EFFORTS.  Lose 10 lbs. FOLLOW A HIGH FIBER/LOW FAT DIET. CONTINUE DEXILANT PRIOR TO YOUR FIRST MEAL. DO NOT USE VOLTAREN AND CELEBREX. AWAIT BIOPSY RESULTS. Follow up in 6 mos. Next colonoscopy in 10  years.   _______________________________ eSignedDanie Binder, MD 05-10-2015 9:53 AM   CPT CODES: ICD CODES:  The ICD and CPT codes recommended by this software are interpretations from the data that the clinical staff has captured with the software.  The verification of the translation of this report to the ICD and CPT codes and modifiers is the sole responsibility of the health care institution and practicing physician where this report was generated.  [employer]. will not be held responsible for the validity of the ICD and CPT codes included on this report.  AMA assumes no liability for data contained or not contained herein. CPT is a Designer, television/film set of the [employer].

## 2015-04-15 NOTE — Anesthesia Preprocedure Evaluation (Signed)
Anesthesia Evaluation  Patient identified by MRN, date of birth, ID band Patient awake    Reviewed: Allergy & Precautions, NPO status , Patient's Chart, lab work & pertinent test results  Airway Mallampati: II  TM Distance: >3 FB     Dental  (+) Edentulous Upper, Edentulous Lower   Pulmonary neg pulmonary ROS,  breath sounds clear to auscultation        Cardiovascular negative cardio ROS  Rhythm:Regular Rate:Normal     Neuro/Psych PSYCHIATRIC DISORDERS (PTSD) Anxiety Depression Bipolar Disorder    GI/Hepatic GERD-  Medicated,(+) Hepatitis -, C  Endo/Other    Renal/GU      Musculoskeletal   Abdominal   Peds  Hematology   Anesthesia Other Findings   Reproductive/Obstetrics                             Anesthesia Physical Anesthesia Plan  ASA: III  Anesthesia Plan: MAC   Post-op Pain Management:    Induction: Intravenous  Airway Management Planned: Simple Face Mask  Additional Equipment:   Intra-op Plan:   Post-operative Plan:   Informed Consent: I have reviewed the patients History and Physical, chart, labs and discussed the procedure including the risks, benefits and alternatives for the proposed anesthesia with the patient or authorized representative who has indicated his/her understanding and acceptance.     Plan Discussed with:   Anesthesia Plan Comments:         Anesthesia Quick Evaluation

## 2015-04-15 NOTE — Transfer of Care (Signed)
Immediate Anesthesia Transfer of Care Note  Patient: Heather Galvan  Procedure(s) Performed: Procedure(s): COLONOSCOPY WITH PROPOFOL; in cecum at 0752 ; total withdrawal time 11 minutes (procedure #1) (N/A) ESOPHAGOGASTRODUODENOSCOPY (EGD) WITH PROPOFOL (procedure #2) (N/A) SAVORY DILATION, 15 mm, 16 mm (N/A) BIOPSY (N/A)  Patient Location: PACU  Anesthesia Type:MAC  Level of Consciousness: awake and patient cooperative  Airway & Oxygen Therapy: Patient Spontanous Breathing and non-rebreather face mask  Post-op Assessment: Report given to RN, Post -op Vital signs reviewed and stable and Patient moving all extremities  Post vital signs: Reviewed and stable    Complications: No apparent anesthesia complications

## 2015-04-15 NOTE — Anesthesia Postprocedure Evaluation (Signed)
Anesthesia Post Note  Patient: Heather Galvan  Procedure(s) Performed: Procedure(s) (LRB): COLONOSCOPY WITH PROPOFOL; in cecum at 0752 ; total withdrawal time 11 minutes (procedure #1) (N/A) ESOPHAGOGASTRODUODENOSCOPY (EGD) WITH PROPOFOL (procedure #2) (N/A) SAVORY DILATION, 15 mm, 16 mm (N/A) BIOPSY (N/A)  Anesthesia type: MAC  Patient location: PACU  Post pain: Pain level controlled  Post assessment: Post-op Vital signs reviewed, Patient's Cardiovascular Status Stable, Respiratory Function Stable, Patent Airway, No signs of Nausea or vomiting and Pain level controlled    Post vital signs: Reviewed and stable  Level of consciousness: awake and alert   Complications: No apparent anesthesia complications

## 2015-04-16 ENCOUNTER — Encounter (HOSPITAL_COMMUNITY): Payer: Self-pay | Admitting: Gastroenterology

## 2015-04-28 ENCOUNTER — Telehealth: Payer: Self-pay

## 2015-04-28 NOTE — Telephone Encounter (Signed)
Pt came by office and thought she had an appointment with [place] today. Pt had procedure recently with [facility] and i did not see any other appointment for her with our office. Pt may need to be contacted to assess this situation

## 2015-04-28 NOTE — Telephone Encounter (Signed)
I called pt and she said she [facility] and [facility] is [facility].

## 2015-05-01 NOTE — Telephone Encounter (Signed)
REVIEWED-NO ADDITIONAL RECOMMENDATIONS. 

## 2015-05-06 NOTE — Telephone Encounter (Signed)
Reminder in epic °

## 2015-05-06 NOTE — Telephone Encounter (Signed)
Pt is aware of results. 

## 2015-05-06 NOTE — Telephone Encounter (Addendum)
Please call pt. HER stomach Bx shows gastritis DUE TO ASA, CELEBREX, AND VOLTAREN.    CONTINUE YOUR WEIGHT LOSS EFFORTS. Lose 10 lbs.  FOLLOW A HIGH FIBER/LOW FAT DIET. AVOID ITEMS THAT CAUSE BLOATING.   CONTINUE DEXILANT PRIOR TO YOUR FIRST MEAL.   DO NOT USE VOLTAREN AND CELEBREX. IT WILL KILL YOUR KIDNEYS.  Follow up in 6 mos E30 HISTORY OF HEP C, F3/F4 FIBROSIS.  Next colonoscopy in 10 years.

## 2015-10-07 ENCOUNTER — Encounter: Payer: Self-pay | Admitting: Gastroenterology

## 2015-11-07 IMAGING — US US ABDOMEN COMPLETE W/ ELASTOGRAPHY
1 series · 13 of 25 positions shown · non-contrast
Comparison: None.

CLINICAL DATA: Chronic hepatitis-C



[Series 1: us abdomen complete w/ elastography · 0.21mm/px · 13 of 50 slices shown]
[im 1/50]
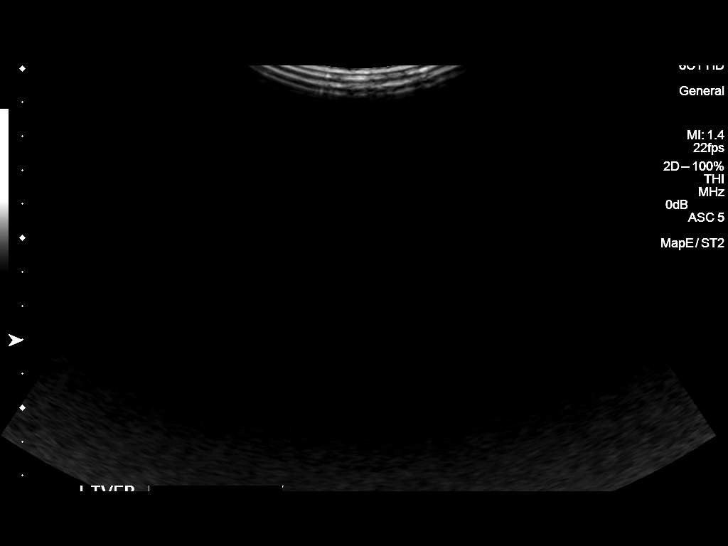
[im 5/50]
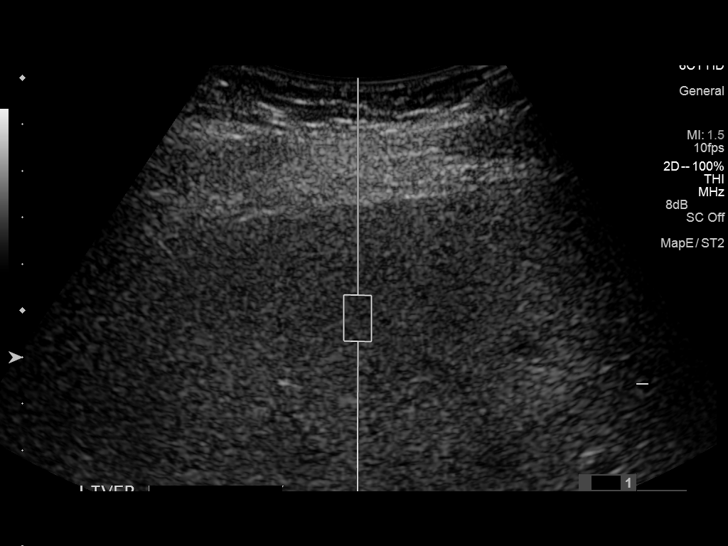
[im 9/50]
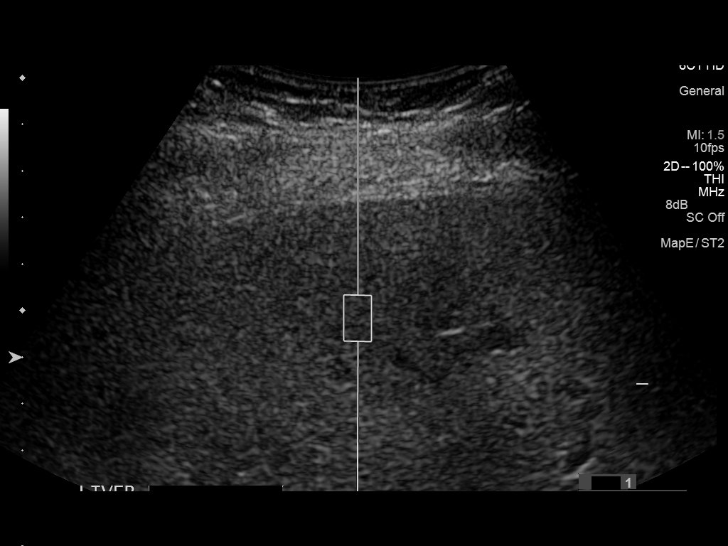
[im 13/50]
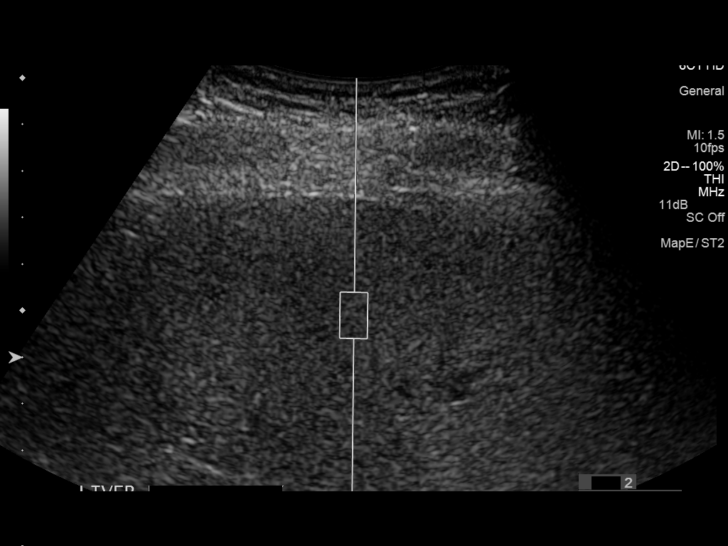
[im 17/50]
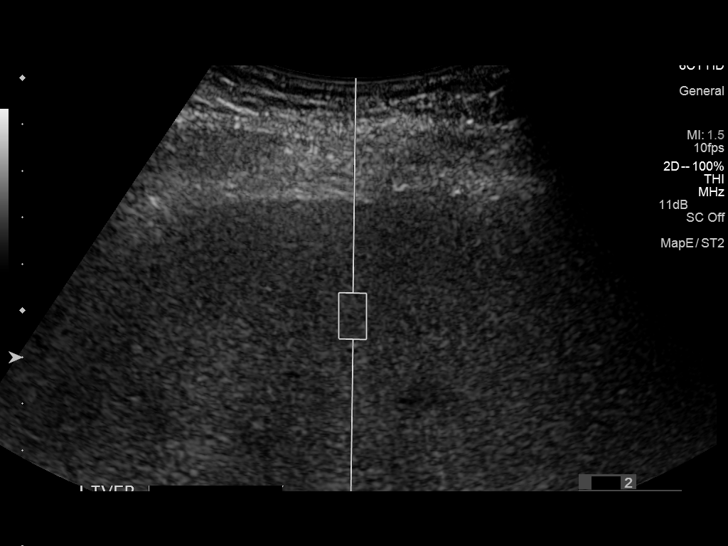
[im 21/50]
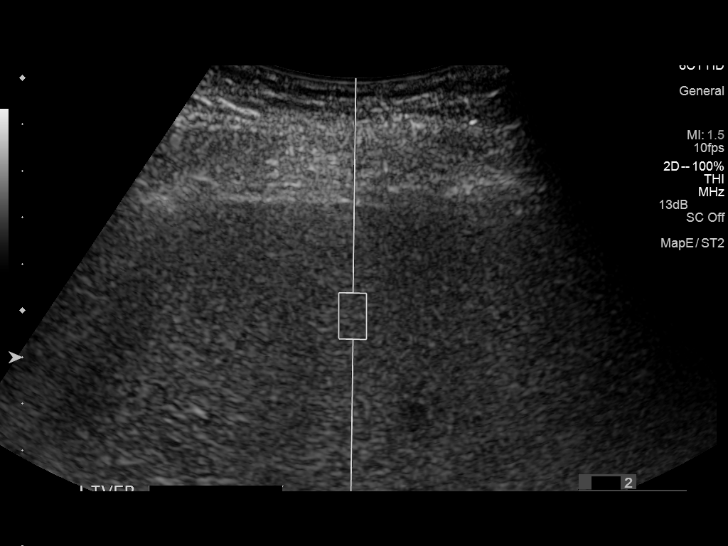
[im 25/50]
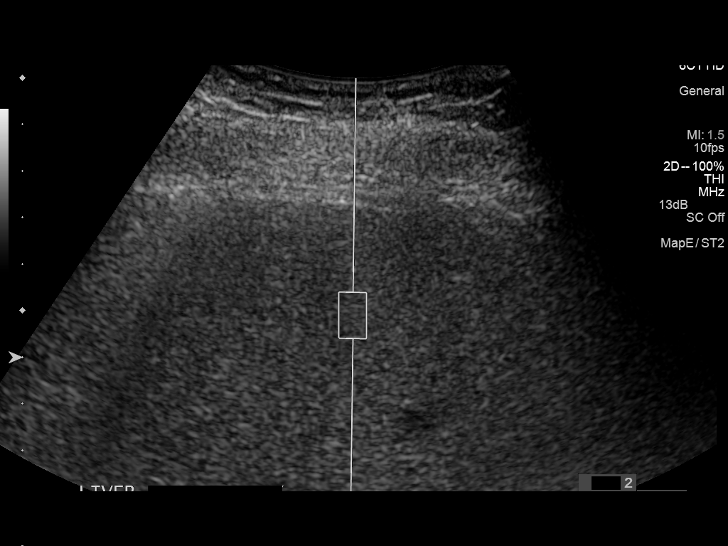
[im 29/50]
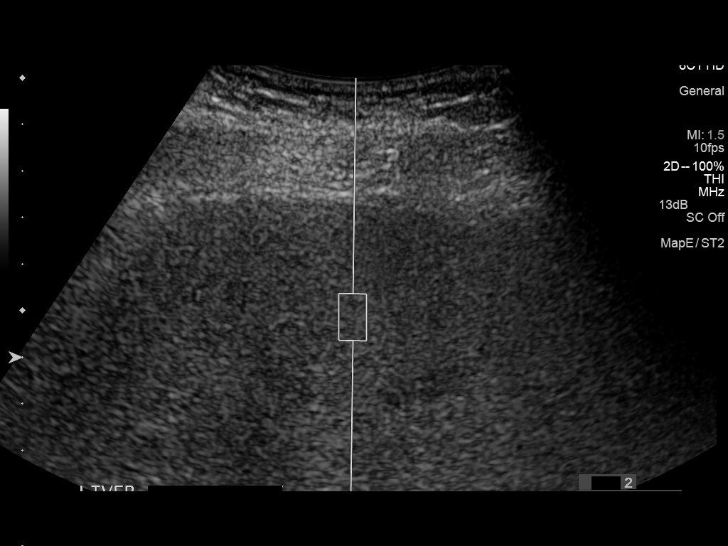
[im 33/50]
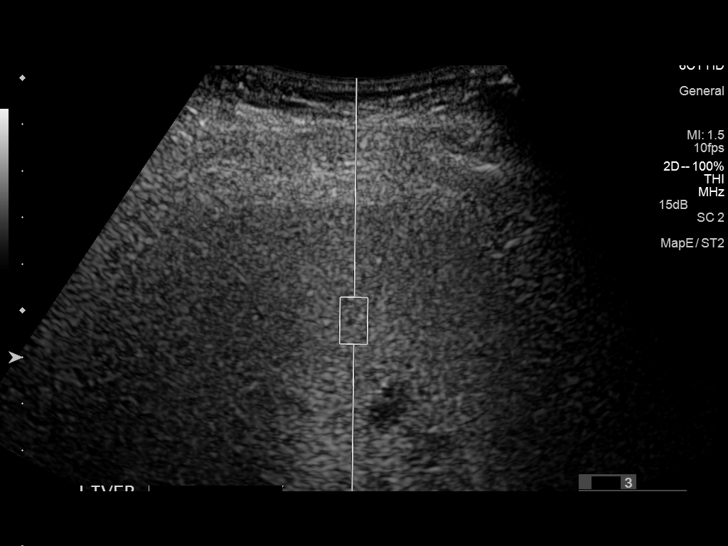
[im 37/50]
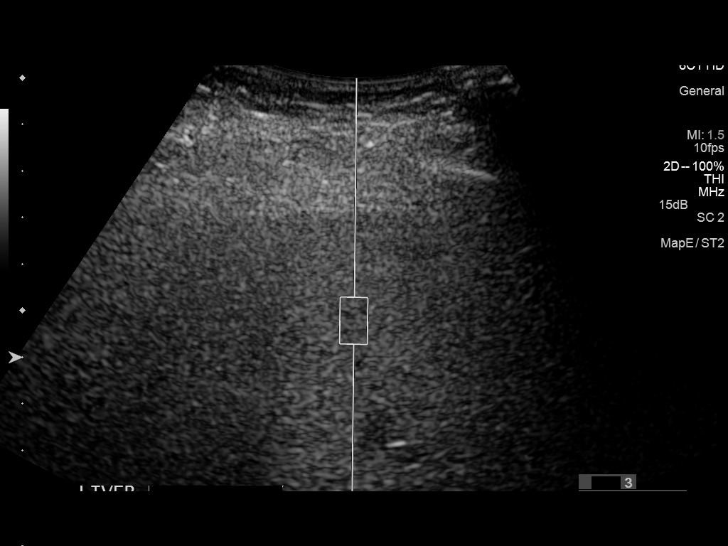
[im 41/50]
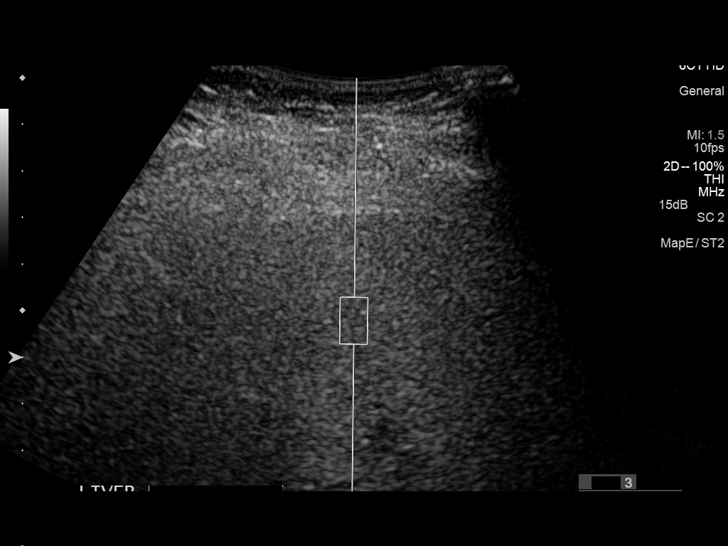
[im 45/50]
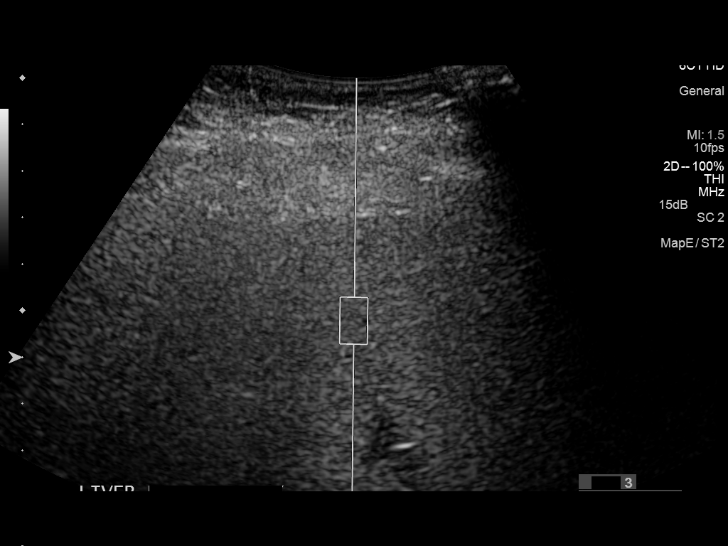
[im 50/50]
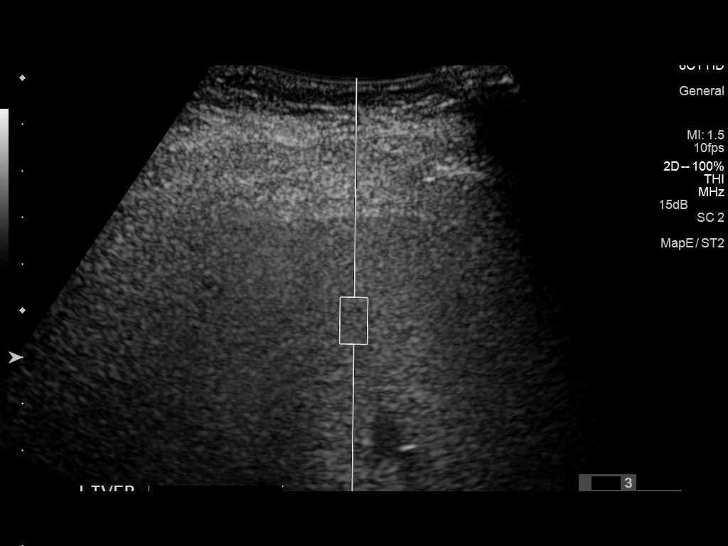

[13 of 25 positions shown; findings below may reference images not displayed]

FINDINGS: ULTRASOUND ABDOMEN

Gallbladder: No gallstones or wall thickening visualized. A 4 mm
polyp is noted. No sonographic Murphy sign noted.

Common bile duct: Diameter: 1.6 mm

Liver: Coarse liver echotexture and increased echogenicity but no
focal hepatic lesions or intrahepatic biliary dilatation.

IVC: Normal caliber

Pancreas: Sonographically unremarkable

Spleen: Normal size.  No focal lesions.

Right Kidney: Length: 11.9 cm. Normal renal cortical thickness and
echogenicity without focal lesions or hydronephrosis.

Left Kidney: Length: 11.5 cm. Normal renal cortical thickness and
echogenicity without focal lesions or hydronephrosis.

Abdominal aorta: No aneurysm visualized.

Other findings: No ascites

ULTRASOUND HEPATIC ELASTOGRAPHY

Device: Siemens Helix VTQ

Transducer 6 HD

Patient position: Left lateral decubitus

Number of measurements:  10

Hepatic Segment:  8

Median velocity:   3.71  m/sec

IQR:

IQR/Median velocity ratio

Corresponding Metavir fibrosis score:  Some F 3+ F 4

Risk of fibrosis: High

Limitations of exam: None

Pertinent findings noted on other imaging exams:  None

Please note that abnormal shear wave velocities may also be
identified in clinical settings other than with hepatic fibrosis,
such as: acute hepatitis, elevated right heart and central venous
pressures including use of beta blockers, Siljar disease
([place]), infiltrative processes such as
mastocytosis/amyloidosis/infiltrative tumor, extrahepatic
cholestasis, in the post-prandial state, and liver transplantation.
Correlation with patient history, laboratory data, and clinical
condition recommended.
IMPRESSION: 1. 4 mm gallbladder polyp but no gallstones or findings for acute
cholecystitis.
2. Normal caliber common bile duct.
3. Coarse liver echogenicity but no focal hepatic lesions or
intrahepatic biliary dilatation.

Median hepatic shear wave velocity is calculated at 3.71 m/sec.

Corresponding Metavir fibrosis score is F 3+ F 4.

Risk of fibrosis is high.

Follow-up:  Advised

## 2015-11-07 IMAGING — US US ABDOMEN COMPLETE W/ ELASTOGRAPHY
1 series · 13 of 25 positions shown · non-contrast
Comparison: None.

CLINICAL DATA: Chronic hepatitis-C



[Series 1: us abdomen complete w/ elastography · 0.18mm/px · 13 of 117 slices shown]
[im 1/117]
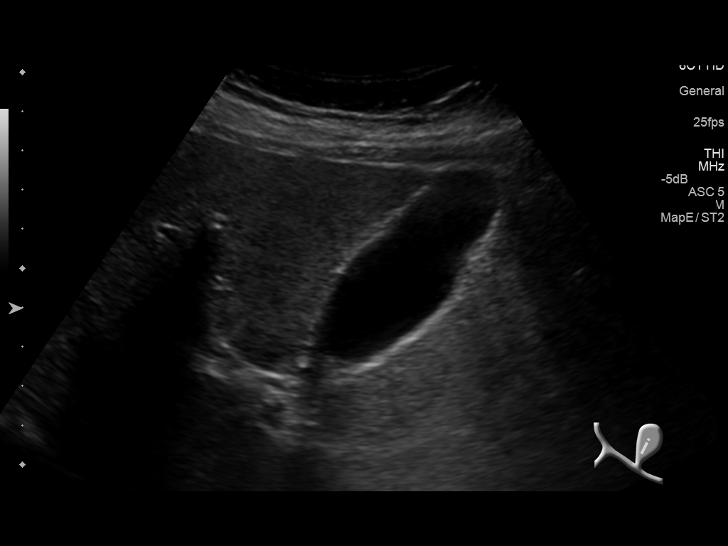
[im 10/117]
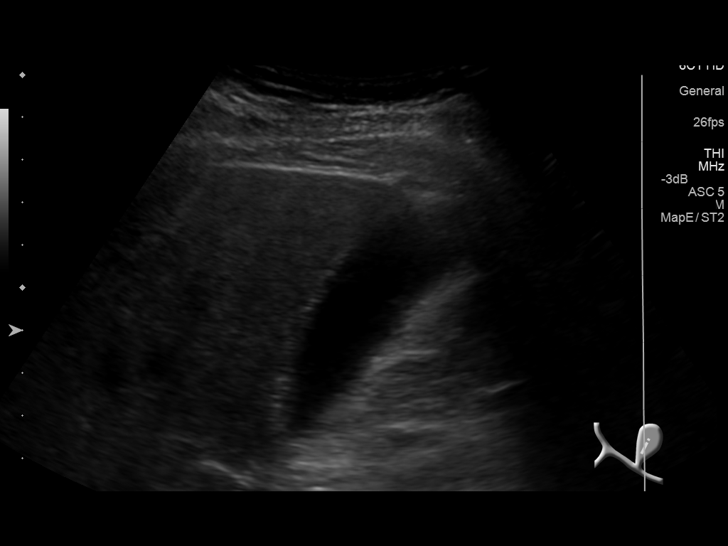
[im 20/117]
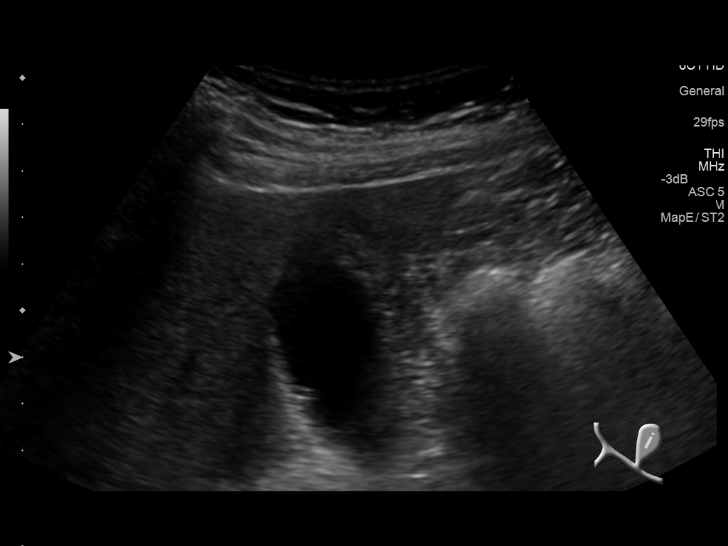
[im 30/117]
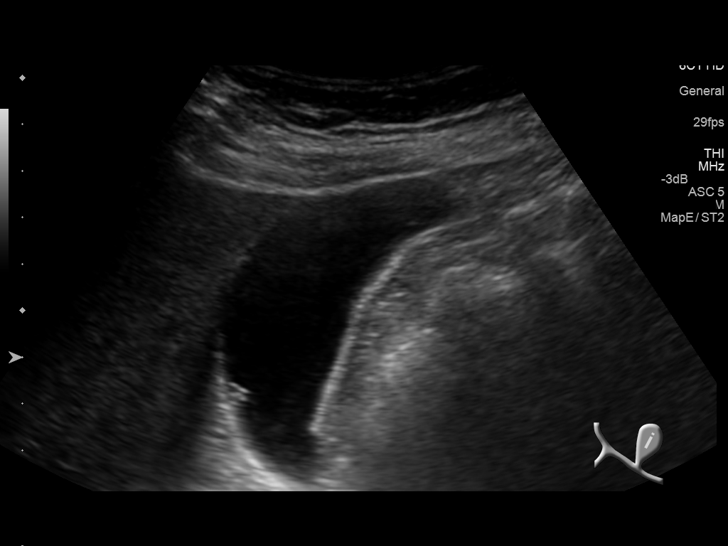
[im 39/117]
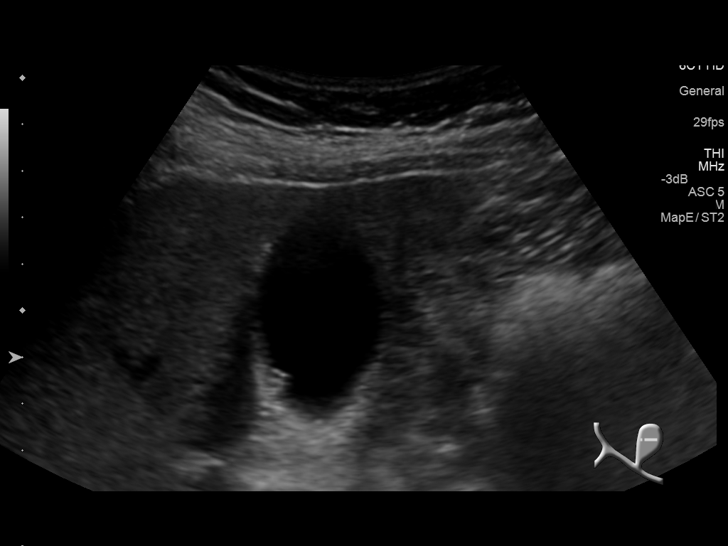
[im 49/117]
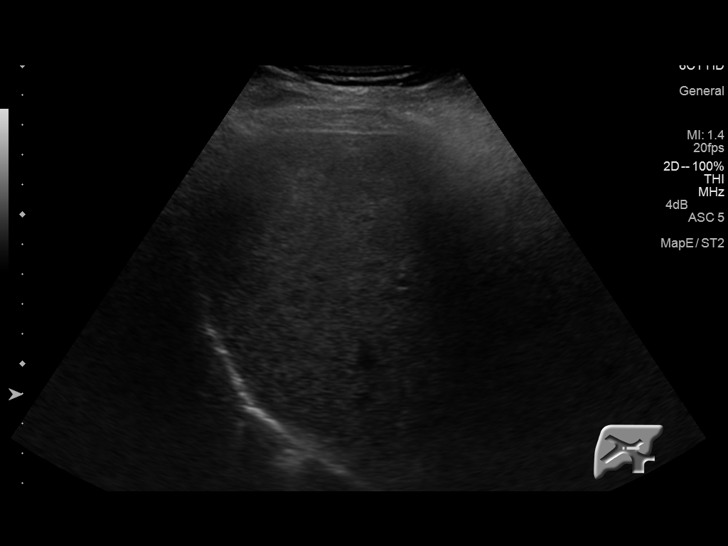
[im 59/117]
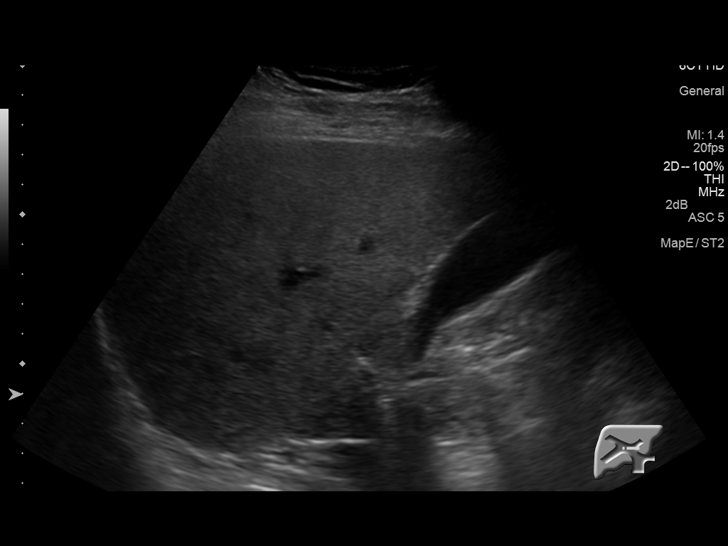
[im 68/117]
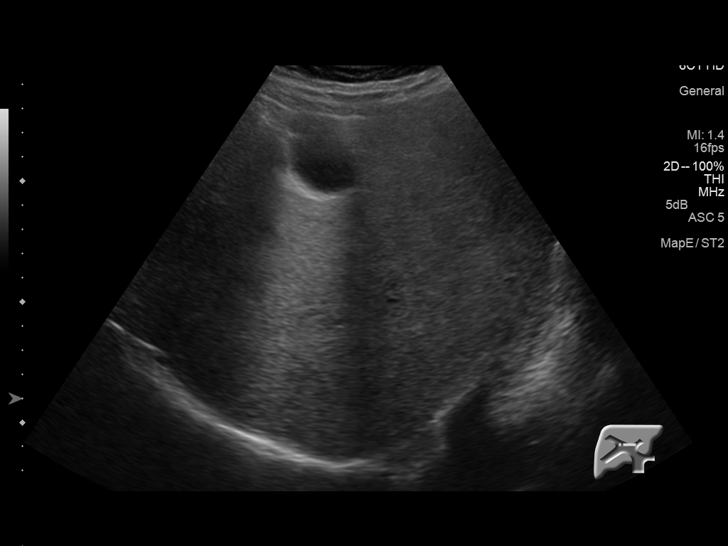
[im 78/117]
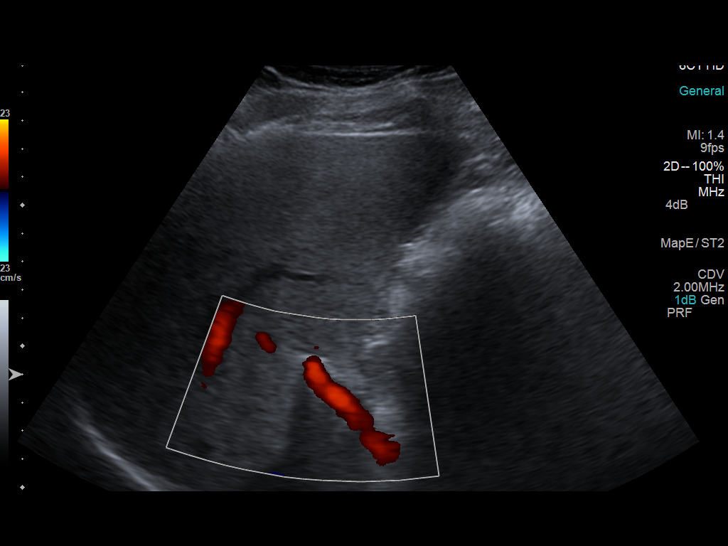
[im 88/117]
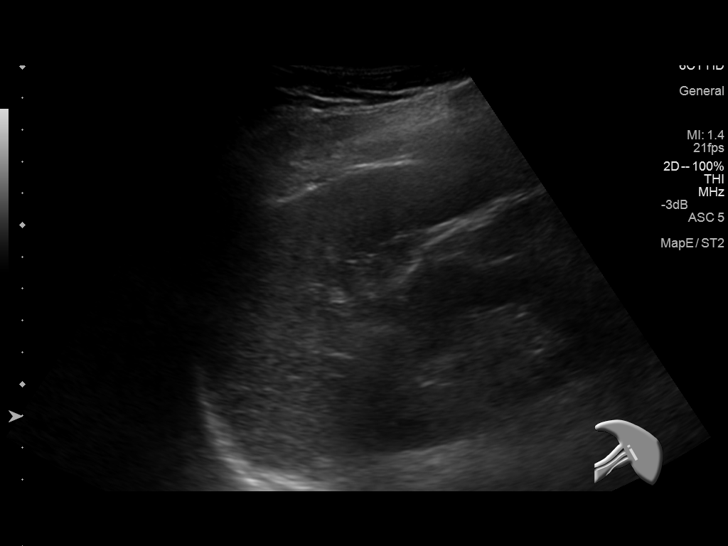
[im 97/117]
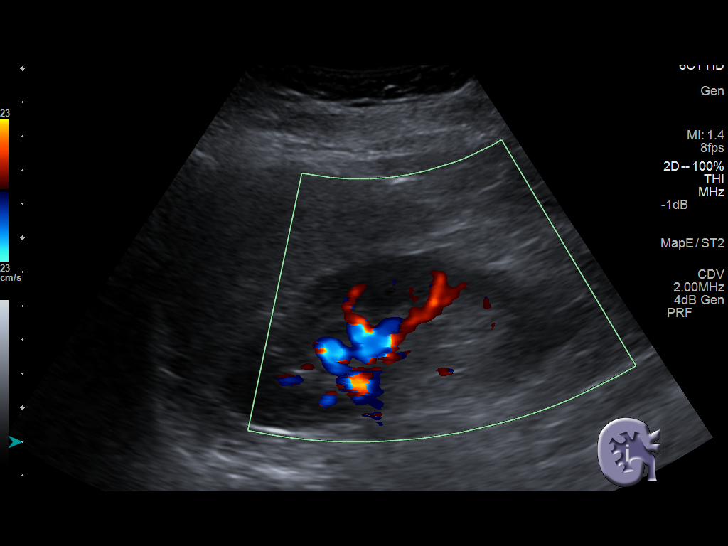
[im 107/117]
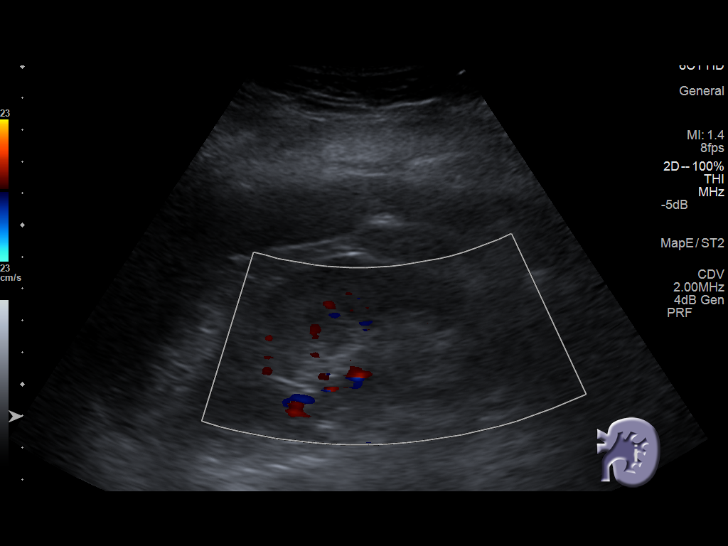
[im 117/117]
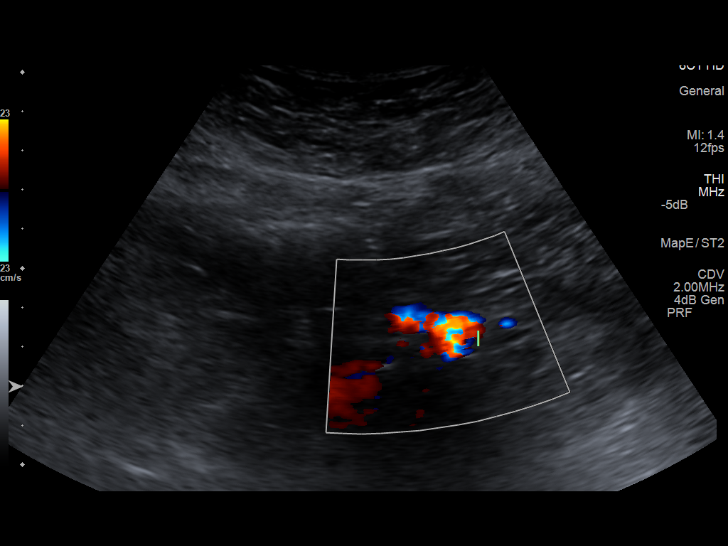

[13 of 25 positions shown; findings below may reference images not displayed]

FINDINGS: ULTRASOUND ABDOMEN

Gallbladder: No gallstones or wall thickening visualized. A 4 mm
polyp is noted. No sonographic Murphy sign noted.

Common bile duct: Diameter: 1.6 mm

Liver: Coarse liver echotexture and increased echogenicity but no
focal hepatic lesions or intrahepatic biliary dilatation.

IVC: Normal caliber

Pancreas: Sonographically unremarkable

Spleen: Normal size.  No focal lesions.

Right Kidney: Length: 11.9 cm. Normal renal cortical thickness and
echogenicity without focal lesions or hydronephrosis.

Left Kidney: Length: 11.5 cm. Normal renal cortical thickness and
echogenicity without focal lesions or hydronephrosis.

Abdominal aorta: No aneurysm visualized.

Other findings: No ascites

ULTRASOUND HEPATIC ELASTOGRAPHY

Device: Siemens Helix VTQ

Transducer 6 HD

Patient position: Left lateral decubitus

Number of measurements:  10

Hepatic Segment:  8

Median velocity:   3.71  m/sec

IQR:

IQR/Median velocity ratio

Corresponding Metavir fibrosis score:  Some F 3+ F 4

Risk of fibrosis: High

Limitations of exam: None

Pertinent findings noted on other imaging exams:  None

Please note that abnormal shear wave velocities may also be
identified in clinical settings other than with hepatic fibrosis,
such as: acute hepatitis, elevated right heart and central venous
pressures including use of beta blockers, Siljar disease
(Kreider), infiltrative processes such as
mastocytosis/amyloidosis/infiltrative tumor, extrahepatic
cholestasis, in the post-prandial state, and liver transplantation.
Correlation with patient history, laboratory data, and clinical
condition recommended.
IMPRESSION: 1. 4 mm gallbladder polyp but no gallstones or findings for acute
cholecystitis.
2. Normal caliber common bile duct.
3. Coarse liver echogenicity but no focal hepatic lesions or
intrahepatic biliary dilatation.

Median hepatic shear wave velocity is calculated at 3.71 m/sec.

Corresponding Metavir fibrosis score is F 3+ F 4.

Risk of fibrosis is high.

Follow-up:  Advised

## 2016-06-14 ENCOUNTER — Ambulatory Visit (INDEPENDENT_AMBULATORY_CARE_PROVIDER_SITE_OTHER): Payer: Medicaid Other | Admitting: Family Medicine

## 2016-06-14 ENCOUNTER — Encounter: Payer: Self-pay | Admitting: Family Medicine

## 2016-06-14 ENCOUNTER — Encounter (INDEPENDENT_AMBULATORY_CARE_PROVIDER_SITE_OTHER): Payer: Self-pay

## 2016-06-14 VITALS — BP 124/64 | HR 92 | Resp 18 | Ht 64.0 in | Wt 181.1 lb

## 2016-06-14 DIAGNOSIS — Z7189 Other specified counseling: Secondary | ICD-10-CM | POA: Diagnosis not present

## 2016-06-14 DIAGNOSIS — F319 Bipolar disorder, unspecified: Secondary | ICD-10-CM | POA: Insufficient documentation

## 2016-06-14 DIAGNOSIS — F5104 Psychophysiologic insomnia: Secondary | ICD-10-CM

## 2016-06-14 DIAGNOSIS — Z23 Encounter for immunization: Secondary | ICD-10-CM

## 2016-06-14 DIAGNOSIS — G4700 Insomnia, unspecified: Secondary | ICD-10-CM

## 2016-06-14 DIAGNOSIS — Z7689 Persons encountering health services in other specified circumstances: Secondary | ICD-10-CM

## 2016-06-14 DIAGNOSIS — R768 Other specified abnormal immunological findings in serum: Secondary | ICD-10-CM

## 2016-06-14 DIAGNOSIS — M5090 Cervical disc disorder, unspecified, unspecified cervical region: Secondary | ICD-10-CM | POA: Diagnosis not present

## 2016-06-14 DIAGNOSIS — G894 Chronic pain syndrome: Secondary | ICD-10-CM

## 2016-06-14 DIAGNOSIS — M79644 Pain in right finger(s): Secondary | ICD-10-CM

## 2016-06-14 DIAGNOSIS — R7689 Other specified abnormal immunological findings in serum: Secondary | ICD-10-CM

## 2016-06-14 DIAGNOSIS — M5030 Other cervical disc degeneration, unspecified cervical region: Secondary | ICD-10-CM | POA: Insufficient documentation

## 2016-06-14 DIAGNOSIS — K219 Gastro-esophageal reflux disease without esophagitis: Secondary | ICD-10-CM

## 2016-06-14 DIAGNOSIS — F32A Depression, unspecified: Secondary | ICD-10-CM | POA: Insufficient documentation

## 2016-06-14 DIAGNOSIS — R894 Abnormal immunological findings in specimens from other organs, systems and tissues: Secondary | ICD-10-CM | POA: Diagnosis not present

## 2016-06-14 DIAGNOSIS — F329 Major depressive disorder, single episode, unspecified: Secondary | ICD-10-CM

## 2016-06-14 DIAGNOSIS — I10 Essential (primary) hypertension: Secondary | ICD-10-CM

## 2016-06-14 DIAGNOSIS — F3170 Bipolar disorder, currently in remission, most recent episode unspecified: Secondary | ICD-10-CM

## 2016-06-14 MED ORDER — RANITIDINE HCL 150 MG PO TABS: 150.0000 mg | ORAL_TABLET | Freq: Two times a day (BID) | ORAL | Status: DC

## 2016-06-14 MED ORDER — RANITIDINE HCL 150 MG PO TABS: 150.0000 mg | ORAL_TABLET | Freq: Two times a day (BID) | ORAL | 1 refills | Status: DC

## 2016-06-14 NOTE — Progress Notes (Signed)
Chief Complaint  Patient presents with  . Establish Care   Heather Galvan is a 60 year old female here to establish care and this is her first visit. She states her primary care has been to the health department. I have no records available for review. These have been requested. The patient has long-standing depression and anxiety. She is under the care of a psychiatric provider. She takes Klonopin, Effexor, and trazodone on a daily basis. She also takes Seroquel to sleep amplitude of her bipolar disorder. She states she is stable at this time. She states that she has cervical disc disease. This causes her chronic pain. She does not remember when she last saw a specialist or had imaging. She has not had recent physical therapy. We discussed the importance of conservative care of her cervical disc disease prior to management with narcotic therapy. The patient has well-controlled hypertension. She does not have any complications of hypertension such as cardiovascular disease. She has GERD. Her symptoms are well-controlled with medication. She has a history of positive hepatitis C test. Her viral test was negative, no treatment is indicated. Patient states she is unable to work due to mental health disability. She states she has had a mammogram within the last 2 years, she states she has had a colonoscopy within the last few years. Both of these were normal. Records will be requested. She is due for flu shot and this is ordered today. She has a new complaint today of right hand pain and numbness. She points to her thenar eminence. She has had no tests or x-rays. No injuries. She has increased pain with use. She is right-handed.  Patient Active Problem List   Diagnosis Date Noted  . Essential hypertension 06/14/2016  . Depression 06/14/2016  . Bipolar disorder ([place]) 06/14/2016  . DDD (degenerative disc disease), cervical 06/14/2016  . Chronic pain syndrome 06/14/2016  . Chronic insomnia 06/14/2016   . GERD (gastroesophageal reflux disease) 03/26/2015  . Dysphagia, pharyngoesophageal phase 03/26/2015  . Hepatitis C antibody test positive 03/26/2015   Outpatient Encounter Prescriptions as of 06/14/2016  Medication Sig  . aspirin 81 MG tablet Take 81 mg by mouth daily.  . clonazePAM (KLONOPIN) 0.5 MG tablet 1/2-1 tablet BID  . dexlansoprazole (DEXILANT) 60 MG capsule 1 po every morning  . gabapentin (NEURONTIN) 300 MG capsule Take 300 mg by mouth 3 (three) times daily as needed.  Marland Kitchen LATUDA 40 MG TABS tablet Take 40 mg by mouth daily.  Marland Kitchen lidocaine (XYLOCAINE) 2 % solution 2 TSP  PO Q4H PRN FOR THROAT PAIN  . lisinopril-hydrochlorothiazide (PRINZIDE,ZESTORETIC) 10-12.5 MG per tablet Take 1 tablet by mouth daily.  . ranitidine (ZANTAC) 150 MG tablet Take 1 tablet (150 mg total) by mouth 2 (two) times daily.  . traMADol (ULTRAM) 50 MG tablet Take 50 mg by mouth 2 (two) times daily.  . traZODone (DESYREL) 150 MG tablet   . trazodone (DESYREL) 300 MG tablet Take 300 mg by mouth at bedtime.  Marland Kitchen venlafaxine XR (EFFEXOR-XR) 150 MG 24 hr capsule   . venlafaxine XR (EFFEXOR-XR) 75 MG 24 hr capsule    No facility-administered encounter medications on file as of 06/14/2016.    BP 124/64   Pulse 92   Resp 18   Ht 5\' 4"  (1.626 m)   Wt 181 lb 1.9 oz (82.2 kg)   SpO2 97%   BMI 31.09 kg/m  Review of Systems  Constitutional: Negative for chills, fever and weight loss.  HENT: Negative for congestion and  hearing loss.        Trouble swallowing at times  Eyes: Negative for blurred vision and pain.  Respiratory: Negative for cough and shortness of breath.   Cardiovascular: Negative for chest pain and leg swelling.  Gastrointestinal: Positive for heartburn. Negative for abdominal pain, constipation and diarrhea.       Heartburn at times  Genitourinary: Negative for dysuria and frequency.  Musculoskeletal: Positive for neck pain. Negative for falls, joint pain and myalgias.       Chronic neck pain.  Right thumb pain  Neurological: Positive for tingling and sensory change. Negative for dizziness, seizures and headaches.       Numbness and tingling right fingers  Psychiatric/Behavioral: Negative for depression. The patient is not nervous/anxious and does not have insomnia.        Bipolar illness, insomnia, stable  Physical Exam  Constitutional: She is oriented to person, place, and time. She appears well-developed and well-nourished. No distress.  HENT:  Head: Normocephalic and atraumatic.  Mouth/Throat: Oropharynx is clear and moist.  Eyes: Pupils are equal, round, and reactive to light.  Neck: Normal range of motion. No thyromegaly present.  Cardiovascular: Normal rate and regular rhythm.   Pulmonary/Chest: Effort normal and breath sounds normal.  Musculoskeletal: Normal range of motion.  Tender right thumb CMC. Good range of motion hand and wrist. Positive Phalen's, negative Tinel's. No sensory deficit.  Lymphadenopathy:    She has no cervical adenopathy.  Neurological: She is alert and oriented to person, place, and time.  Psychiatric: She has a normal mood and affect. Her behavior is normal.  1. Encounter to establish care with new doctor   2. Pain of right thumb I suspect some CMC arthritis, possible carpal tunnel syndrome - Wrist brace cock up volar - Nerve conduction test; Future  3. Hepatitis C antibody test positive Viral titer negative  4. Cervical neck pain with evidence of disc disease Continue tramadol for pain. When patient comes back we'll discuss imaging and additional conservative management  5. Gastroesophageal reflux disease, esophagitis presence not specified Continue current medications  6. Essential hypertension Needed for health maintenance. Blood pressure well controlled - COMPLETE METABOLIC PANEL WITH GFR - Vitamin D (25 hydroxy) - B12 - CBC with Differential - Lipid Profile  7. Depression Stable  8. Encounter for immunization  - Flu  Vaccine QUAD 36+ mos IM  9. Bipolar affective disorder in remission (HCC) Stable  10. DDD (degenerative disc disease), cervical   11. Chronic pain syndrome   12. Chronic insomnia   Patient Instructions  You may take the tramadol three times a day Need to get a brace for your thumb I will order a nerve test Continue same medicines Need lab work and a check up See me in a month

## 2016-06-14 NOTE — Patient Instructions (Addendum)
You may take the tramadol three times a day Need to get a brace for your thumb I will order a nerve test Continue same medicines Need lab work and a check up See me in a month

## 2016-07-15 ENCOUNTER — Ambulatory Visit (INDEPENDENT_AMBULATORY_CARE_PROVIDER_SITE_OTHER): Payer: Medicare Other | Admitting: Family Medicine

## 2016-07-15 ENCOUNTER — Encounter: Payer: Self-pay | Admitting: Family Medicine

## 2016-07-15 ENCOUNTER — Telehealth: Payer: Self-pay | Admitting: Family Medicine

## 2016-07-15 VITALS — BP 120/80 | HR 71 | Ht 64.0 in | Wt 183.0 lb

## 2016-07-15 DIAGNOSIS — Z1239 Encounter for other screening for malignant neoplasm of breast: Secondary | ICD-10-CM

## 2016-07-15 DIAGNOSIS — M5030 Other cervical disc degeneration, unspecified cervical region: Secondary | ICD-10-CM | POA: Diagnosis not present

## 2016-07-15 DIAGNOSIS — F329 Major depressive disorder, single episode, unspecified: Secondary | ICD-10-CM | POA: Diagnosis not present

## 2016-07-15 DIAGNOSIS — I10 Essential (primary) hypertension: Secondary | ICD-10-CM | POA: Diagnosis not present

## 2016-07-15 DIAGNOSIS — F32A Depression, unspecified: Secondary | ICD-10-CM

## 2016-07-15 DIAGNOSIS — E559 Vitamin D deficiency, unspecified: Secondary | ICD-10-CM

## 2016-07-15 DIAGNOSIS — Z1231 Encounter for screening mammogram for malignant neoplasm of breast: Secondary | ICD-10-CM

## 2016-07-15 DIAGNOSIS — Z79899 Other long term (current) drug therapy: Secondary | ICD-10-CM | POA: Diagnosis not present

## 2016-07-15 LAB — CBC WITH DIFFERENTIAL/PLATELET
BASOS PCT: 1 %
Basophils Absolute: 104 cells/uL (ref 0–200)
EOS ABS: 0 {cells}/uL — AB (ref 15–500)
Eosinophils Relative: 0 %
HCT: 44.1 % (ref 35.0–45.0)
Hemoglobin: 15.1 g/dL (ref 11.7–15.5)
Lymphocytes Relative: 34 %
Lymphs Abs: 3536 cells/uL (ref 850–3900)
MCH: 31.7 pg (ref 27.0–33.0)
MCHC: 34.2 g/dL (ref 32.0–36.0)
MCV: 92.5 fL (ref 80.0–100.0)
MONOS PCT: 9 %
MPV: 10.4 fL (ref 7.5–12.5)
Monocytes Absolute: 936 cells/uL (ref 200–950)
NEUTROS ABS: 5824 {cells}/uL (ref 1500–7800)
Neutrophils Relative %: 56 %
PLATELETS: 284 10*3/uL (ref 140–400)
RBC: 4.77 MIL/uL (ref 3.80–5.10)
RDW: 13.1 % (ref 11.0–15.0)
WBC: 10.4 10*3/uL (ref 3.8–10.8)

## 2016-07-15 LAB — LIPID PANEL
CHOLESTEROL: 235 mg/dL — AB (ref 125–200)
HDL: 53 mg/dL (ref >=46)
LDL CALC: 147 mg/dL — AB (ref <130)
Total CHOL/HDL Ratio: 4.4 Ratio (ref <=5.0)
Triglycerides: 173 mg/dL — ABNORMAL HIGH (ref <150)
VLDL: 35 mg/dL — ABNORMAL HIGH (ref <30)

## 2016-07-15 LAB — TSH: TSH: 1.65 mIU/L

## 2016-07-15 LAB — COMPLETE METABOLIC PANEL WITH GFR
ALT: 23 U/L (ref 6–29)
AST: 21 U/L (ref 10–35)
Albumin: 4.3 g/dL (ref 3.6–5.1)
Alkaline Phosphatase: 85 U/L (ref 33–130)
BILIRUBIN TOTAL: 0.4 mg/dL (ref 0.2–1.2)
BUN: 11 mg/dL (ref 7–25)
CHLORIDE: 102 mmol/L (ref 98–110)
CO2: 31 mmol/L (ref 20–31)
Calcium: 10.1 mg/dL (ref 8.6–10.4)
Creat: 1.05 mg/dL (ref 0.50–1.05)
GFR, Est African American: 67 mL/min (ref >=60)
GFR, Est Non African American: 58 mL/min — ABNORMAL LOW (ref >=60)
GLUCOSE: 48 mg/dL — AB (ref 65–99)
Potassium: 4.6 mmol/L (ref 3.5–5.3)
SODIUM: 141 mmol/L (ref 135–146)
TOTAL PROTEIN: 6.8 g/dL (ref 6.1–8.1)

## 2016-07-15 LAB — VITAMIN B12: VITAMIN B 12: 757 pg/mL (ref 200–1100)

## 2016-07-15 MED ORDER — LISINOPRIL-HYDROCHLOROTHIAZIDE 10-12.5 MG PO TABS: 1.0000 | ORAL_TABLET | Freq: Every day | ORAL | 6 refills | Status: AC

## 2016-07-15 MED ORDER — RANITIDINE HCL 150 MG PO TABS: 150.0000 mg | ORAL_TABLET | Freq: Two times a day (BID) | ORAL | 6 refills | Status: AC

## 2016-07-15 MED ORDER — GABAPENTIN 300 MG PO CAPS: 300.0000 mg | ORAL_CAPSULE | Freq: Three times a day (TID) | ORAL | 6 refills | Status: AC | PRN

## 2016-07-15 MED ORDER — DEXLANSOPRAZOLE 60 MG PO CPDR: DELAYED_RELEASE_CAPSULE | ORAL | 6 refills | Status: AC

## 2016-07-15 MED ORDER — TRAMADOL HCL 50 MG PO TABS: 100.0000 mg | ORAL_TABLET | Freq: Two times a day (BID) | ORAL | 3 refills | Status: DC

## 2016-07-15 NOTE — Progress Notes (Signed)
Chief Complaint  Patient presents with  . Hypertension    1 month follow up visit   . DJD  Patient is here for follow-up. She states that her neck is feeling a little bit better. She's increased her tramadol to 2 pills twice a day. This is been an improvement. All of her other medications are the same. Depression are well controlled. She sees a Social worker through [employer]. She is unhappy with her [place]. She is prescribed 40 mg a day. She only takes 20 mg a day. In spite of this she has had weight gain. She wants to stop the medication. She was treated for a diet pill. I told her that she needs to discuss any change in her medications with her psychiatric provider and should not be doing this on her own. Blood pressure is well controlled. Patient has GERD and dysphagia. Symptoms are well-controlled on medication. She needs medication refills. She is on long-term antipsychotic therapy. She has not had blood work in some time. I feel she needs screening blood work is ordered today.    Patient Active Problem List   Diagnosis Date Noted  . Essential hypertension 06/14/2016  . Depression 06/14/2016  . Bipolar disorder ([place]) 06/14/2016  . DDD (degenerative disc disease), cervical 06/14/2016  . Chronic pain syndrome 06/14/2016  . Chronic insomnia 06/14/2016  . GERD (gastroesophageal reflux disease) 03/26/2015  . Dysphagia, pharyngoesophageal phase 03/26/2015  . Hepatitis C antibody test positive 03/26/2015    Outpatient Encounter Prescriptions as of 07/15/2016  Medication Sig  . aspirin 81 MG tablet Take 81 mg by mouth daily.  . clonazePAM (KLONOPIN) 0.5 MG tablet 1/2-1 tablet BID  . dexlansoprazole (DEXILANT) 60 MG capsule 1 po every morning  . gabapentin (NEURONTIN) 300 MG capsule Take 1 capsule (300 mg total) by mouth 3 (three) times daily as needed.  Marland Kitchen LATUDA 40 MG TABS tablet Take 40 mg by mouth daily.  Marland Kitchen lidocaine (XYLOCAINE) 2 % solution 2 TSP  PO Q4H PRN FOR THROAT PAIN  .  lisinopril-hydrochlorothiazide (PRINZIDE,ZESTORETIC) 10-12.5 MG tablet Take 1 tablet by mouth daily.  . ranitidine (ZANTAC) 150 MG tablet Take 1 tablet (150 mg total) by mouth 2 (two) times daily.  . traMADol (ULTRAM) 50 MG tablet Take 2 tablets (100 mg total) by mouth 2 (two) times daily.  . traZODone (DESYREL) 150 MG tablet   . trazodone (DESYREL) 300 MG tablet Take 300 mg by mouth at bedtime.  Marland Kitchen venlafaxine XR (EFFEXOR-XR) 150 MG 24 hr capsule   . venlafaxine XR (EFFEXOR-XR) 75 MG 24 hr capsule    No facility-administered encounter medications on file as of 07/15/2016.     No Known Allergies  Review of Systems  Constitutional: Positive for unexpected weight change. Negative for activity change and appetite change.  HENT: Negative for congestion, dental problem and rhinorrhea.   Eyes: Negative.  Negative for visual disturbance.  Respiratory: Negative.  Negative for cough and shortness of breath.   Cardiovascular: Negative.  Negative for chest pain, palpitations and leg swelling.  Gastrointestinal: Negative.  Negative for blood in stool, constipation and diarrhea.  Genitourinary: Negative for difficulty urinating and dysuria.  Musculoskeletal: Positive for back pain and neck pain.  Skin: Negative.  Negative for rash.  Neurological: Negative.  Negative for dizziness and headaches.  Psychiatric/Behavioral: Positive for agitation, behavioral problems and dysphoric mood.       Controlled   BP 120/80   Pulse 71   Ht 5\' 4"  (1.626 m)  Wt 183 lb (83 kg)   SpO2 96%   BMI 31.41 kg/m   Physical Exam  Constitutional: She is oriented to person, place, and time. She appears well-developed and well-nourished.  Overweight  HENT:  Head: Normocephalic and atraumatic.  Mouth/Throat: Oropharynx is clear and moist.  Edentulous  Eyes: Conjunctivae are normal. Pupils are equal, round, and reactive to light.  Neck: Normal range of motion. Neck supple. No thyromegaly present.  Normal range today    Cardiovascular: Normal rate, regular rhythm and normal heart sounds.   Pulmonary/Chest: Effort normal and breath sounds normal. No respiratory distress.  Abdominal: Soft. Bowel sounds are normal. There is no tenderness.  Rotund  Musculoskeletal: Normal range of motion. She exhibits no edema.  Brace on right hand  Lymphadenopathy:    She has no cervical adenopathy.  Neurological: She is alert and oriented to person, place, and time.  Gait normal  Skin: Skin is warm and dry.  Psychiatric: She has a normal mood and affect. Her behavior is normal. Thought content normal.  Nursing note and vitals reviewed.   ASSESSMENT/PLAN:  1. Essential hypertension   2. DDD (degenerative disc disease), cervical   3. Depression Patient expressed desire to have me prescribe all of her psychiatric medications and she is stable. I told her that I was unwilling to assume responsibility for her bipolar illness and chronic clonazepam use for anxiety. She needs to continue with her psychiatric provider  4. High risk medication use  - Vitamin B12 - CBC with Differential/Platelet - COMPLETE METABOLIC PANEL WITH GFR - Lipid panel - TSH - HgB A1c - Urinalysis, Routine w reflex microscopic  5. Screening for breast cancer   6. Vitamin D deficiency  - VITAMIN D 25 Hydroxy (Vit-D Deficiency, Fractures)  7. Visit for screening mammogram  - MM SCREENING BREAST TOMO BILATERAL; Future   Patient Instructions  Need lab and urine testing Need mammogram  Continue current medicines  Stay as active as you can manage and walk everyday  Talk to your psychiatrist about the [place] and weight gain   Raylene Everts, MD

## 2016-07-15 NOTE — Telephone Encounter (Signed)
Heather Galvan is calling stating that she needs her Rx's called in for Effexor and she states that the new Tramdol Rx needs to be sent to the pharmacy with new dosage, please advise?

## 2016-07-15 NOTE — Telephone Encounter (Signed)
I told her that her psychiatrist needs to continue her psych meds.  Will give one refill only If she calls her doctor and is unsuccessful. I gave her a new Rx - printed - for the tramadol with the new dose

## 2016-07-15 NOTE — Telephone Encounter (Signed)
Patient aware that she should contact [facility] for psych meds.   States that [place] told her that Tramadol could not be filled with current directions.   Will call and clarify with pharmacy.

## 2016-07-15 NOTE — Patient Instructions (Signed)
Need lab and urine testing Need mammogram  Continue current medicines  Stay as active as you can manage and walk everyday  Talk to your psychiatrist about the [place] and weight gain

## 2016-07-16 ENCOUNTER — Encounter: Payer: Self-pay | Admitting: Family Medicine

## 2016-07-16 LAB — URINALYSIS, ROUTINE W REFLEX MICROSCOPIC
BILIRUBIN URINE: NEGATIVE
Glucose, UA: NEGATIVE
Hgb urine dipstick: NEGATIVE
Ketones, ur: NEGATIVE
Leukocytes, UA: NEGATIVE
Nitrite: NEGATIVE
PH: 6.5 (ref 5.0–8.0)
Protein, ur: NEGATIVE
SPECIFIC GRAVITY, URINE: 1.006 (ref 1.001–1.035)

## 2016-07-16 LAB — VITAMIN D 25 HYDROXY (VIT D DEFICIENCY, FRACTURES): VIT D 25 HYDROXY: 23 ng/mL — AB (ref 30–100)

## 2016-07-16 LAB — HEMOGLOBIN A1C
Hgb A1c MFr Bld: 5.3 % (ref <5.7)
Mean Plasma Glucose: 105 mg/dL

## 2016-07-16 NOTE — Telephone Encounter (Signed)
Spoke with [facility] pharmacist.  Patient was given #[id number] on  05/29/2016 from another provider with the directions to take 1-2.  With new directions patient should have 10 day supply left.  She can have filled on 07/22/2016

## 2016-07-16 NOTE — Telephone Encounter (Signed)
Unable to reach patient. No voicemail available.  

## 2016-08-19 ENCOUNTER — Ambulatory Visit (HOSPITAL_COMMUNITY)
Admission: RE | Admit: 2016-08-19 | Discharge: 2016-08-19 | Disposition: A | Discharge status: Home / Self Care | Payer: Medicare Other | Source: Ambulatory Visit | Attending: Family Medicine | Admitting: Family Medicine

## 2016-08-19 DIAGNOSIS — Z1231 Encounter for screening mammogram for malignant neoplasm of breast: Secondary | ICD-10-CM | POA: Diagnosis present

## 2016-08-23 ENCOUNTER — Ambulatory Visit (HOSPITAL_COMMUNITY): Payer: Medicare Other

## 2016-09-21 ENCOUNTER — Ambulatory Visit (INDEPENDENT_AMBULATORY_CARE_PROVIDER_SITE_OTHER): Payer: Medicare Other | Admitting: Family Medicine

## 2016-09-21 ENCOUNTER — Encounter: Payer: Self-pay | Admitting: Family Medicine

## 2016-09-21 VITALS — BP 150/82 | HR 76 | Temp 98.1°F | Resp 18 | Ht 64.0 in | Wt 188.0 lb

## 2016-09-21 DIAGNOSIS — M79644 Pain in right finger(s): Secondary | ICD-10-CM | POA: Diagnosis not present

## 2016-09-21 DIAGNOSIS — R739 Hyperglycemia, unspecified: Secondary | ICD-10-CM | POA: Diagnosis not present

## 2016-09-21 LAB — GLUCOSE, RANDOM: GLUCOSE: 74 mg/dL (ref 65–99)

## 2016-09-21 NOTE — Progress Notes (Signed)
Chief Complaint  Patient presents with  . Follow-up    blood sugar   Was visiting family and her sugar went up to 276 random.  Her brother is a diabetic and used his meter.  Next am it was 172,  Not a diabetic but is on chronic atypical antipsychotics and is obese with family history - so need to follow Feels well Some problem with balance No polyuria or thirst Known vit D deficiency and is not yet on medicine.   Patient Active Problem List   Diagnosis Date Noted  . Essential hypertension 06/14/2016  . Depression 06/14/2016  . Bipolar disorder ([place]) 06/14/2016  . DDD (degenerative disc disease), cervical 06/14/2016  . Chronic pain syndrome 06/14/2016  . Chronic insomnia 06/14/2016  . GERD (gastroesophageal reflux disease) 03/26/2015  . Dysphagia, pharyngoesophageal phase 03/26/2015  . Hepatitis C antibody test positive 03/26/2015    Outpatient Encounter Prescriptions as of 09/21/2016  Medication Sig  . aspirin 81 MG tablet Take 81 mg by mouth daily.  . clonazePAM (KLONOPIN) 0.5 MG tablet 1/2-1 tablet BID  . dexlansoprazole (DEXILANT) 60 MG capsule 1 po every morning  . gabapentin (NEURONTIN) 300 MG capsule Take 1 capsule (300 mg total) by mouth 3 (three) times daily as needed.  Marland Kitchen LATUDA 40 MG TABS tablet Take 40 mg by mouth daily.  Marland Kitchen lidocaine (XYLOCAINE) 2 % solution 2 TSP  PO Q4H PRN FOR THROAT PAIN  . lisinopril-hydrochlorothiazide (PRINZIDE,ZESTORETIC) 10-12.5 MG tablet Take 1 tablet by mouth daily.  . ranitidine (ZANTAC) 150 MG tablet Take 1 tablet (150 mg total) by mouth 2 (two) times daily.  . traMADol (ULTRAM) 50 MG tablet Take 2 tablets (100 mg total) by mouth 2 (two) times daily.  . trazodone (DESYREL) 300 MG tablet Take 300 mg by mouth at bedtime.  Marland Kitchen venlafaxine XR (EFFEXOR-XR) 150 MG 24 hr capsule   . venlafaxine XR (EFFEXOR-XR) 75 MG 24 hr capsule    No facility-administered encounter medications on file as of 09/21/2016.     No Known Allergies  Review of  Systems  Constitutional: Negative for fatigue and unexpected weight change.  HENT: Negative.  Negative for postnasal drip and rhinorrhea.   Eyes: Negative.  Negative for visual disturbance.  Respiratory: Negative.  Negative for cough.   Cardiovascular: Negative for chest pain, palpitations and leg swelling.  Gastrointestinal: Negative for constipation, nausea and vomiting.  Endocrine: Negative for polydipsia, polyphagia and polyuria.  Genitourinary: Negative for difficulty urinating and frequency.  Musculoskeletal: Positive for arthralgias.       Right thumb  Skin: Negative.   Neurological: Positive for dizziness.  Psychiatric/Behavioral:       Depression well controlled    BP (!) 150/82   Pulse 76   Temp 98.1 F (36.7 C) (Oral)   Resp 18   Ht 5\' 4"  (1.626 m)   Wt 188 lb (85.3 kg)   LMP 08/19/2016   SpO2 95%   BMI 32.27 kg/m   Physical Exam  Constitutional: She appears well-developed and well-nourished. No distress.  HENT:  Head: Normocephalic.  Mouth/Throat: Oropharynx is clear and moist.  Eyes: Conjunctivae are normal. Pupils are equal, round, and reactive to light.  Neck: Normal range of motion. No thyromegaly present.  Cardiovascular: Normal rate, regular rhythm and normal heart sounds.   Pulmonary/Chest: Effort normal and breath sounds normal. No respiratory distress.  Abdominal: Soft. Bowel sounds are normal.  Musculoskeletal: Normal range of motion. She exhibits no edema.  Right thenar eminence swollen,  limited ROM thumb, numbness tip of thumb, pos phalens  Lymphadenopathy:    She has no cervical adenopathy.  Psychiatric: She has a normal mood and affect. Her behavior is normal.    ASSESSMENT/PLAN:  1. Thumb pain, right  - Ambulatory referral to Orthopedic Surgery  2. Elevated blood sugar  - Hemoglobin A1c - Glucose - Urinalysis   Patient Instructions  Need vitamin D3 2000 U a day  I will refer to orthopedic for the thumb pain  I am checking a  blood sugar test - go to the lab  I will send you a letter with your test results.  If there is anything of concern, we will call right away.      Raylene Everts, MD

## 2016-09-21 NOTE — Patient Instructions (Addendum)
Need vitamin D3 2000 U a day  I will refer to orthopedic for the thumb pain  I am checking a blood sugar test - go to the lab  I will send you a letter with your test results.  If there is anything of concern, we will call right away.

## 2016-09-22 ENCOUNTER — Telehealth: Payer: Self-pay | Admitting: Family Medicine

## 2016-09-22 LAB — URINALYSIS
BILIRUBIN URINE: NEGATIVE
GLUCOSE, UA: NEGATIVE
Hgb urine dipstick: NEGATIVE
LEUKOCYTES UA: NEGATIVE
Nitrite: NEGATIVE
PH: 5.5 (ref 5.0–8.0)
PROTEIN: NEGATIVE
SPECIFIC GRAVITY, URINE: 1.025 (ref 1.001–1.035)

## 2016-09-22 LAB — HEMOGLOBIN A1C
HEMOGLOBIN A1C: 5.2 % (ref <5.7)
Mean Plasma Glucose: 103 mg/dL

## 2016-09-22 NOTE — Telephone Encounter (Signed)
Called pt, aware of blood sugar results.

## 2016-09-22 NOTE — Telephone Encounter (Signed)
-----   Message from Raylene Everts, MD sent at 09/22/2016 12:25 PM EST ----- Please call Ms Morishita and let her know her sugar is normal.

## 2016-11-02 ENCOUNTER — Other Ambulatory Visit: Payer: Self-pay | Admitting: Family Medicine

## 2016-11-22 ENCOUNTER — Ambulatory Visit: Payer: Medicare Other | Admitting: Family Medicine

## 2016-11-22 ENCOUNTER — Encounter: Payer: Self-pay | Admitting: Family Medicine

## 2016-11-29 NOTE — Telephone Encounter (Signed)
Seen 09/21/16

## 2016-11-29 NOTE — Telephone Encounter (Signed)
Recent use?

## 2017-01-25 ENCOUNTER — Encounter: Payer: Self-pay | Admitting: Orthopaedic Surgery

## 2017-12-26 ENCOUNTER — Encounter: Payer: Self-pay | Admitting: Family Medicine

## 2018-01-03 ENCOUNTER — Telehealth: Payer: Self-pay

## 2018-01-03 NOTE — Telephone Encounter (Signed)
Talked with pt today regarding returned mail - Dr Meda Coffee 12/26/17 letter returned.  Pt has moved to [facility] and said to remove Dr Meda Coffee as PCP.  She already has a new PCP doctor.
